# Patient Record
Sex: Female | Born: 1960 | Race: White | Hispanic: No | Marital: Married | Smoking: Never smoker
Health system: Southern US, Community
[De-identification: ages and names within clinical notes are randomized; demographics above are authoritative.]

## PROBLEM LIST (undated history)

## (undated) DIAGNOSIS — T7840XA Allergy, unspecified, initial encounter: Secondary | ICD-10-CM

## (undated) DIAGNOSIS — Z9013 Acquired absence of bilateral breasts and nipples: Secondary | ICD-10-CM

## (undated) DIAGNOSIS — Z5189 Encounter for other specified aftercare: Secondary | ICD-10-CM

## (undated) DIAGNOSIS — E079 Disorder of thyroid, unspecified: Secondary | ICD-10-CM

## (undated) DIAGNOSIS — C801 Malignant (primary) neoplasm, unspecified: Secondary | ICD-10-CM

## (undated) DIAGNOSIS — K279 Peptic ulcer, site unspecified, unspecified as acute or chronic, without hemorrhage or perforation: Secondary | ICD-10-CM

## (undated) DIAGNOSIS — F32A Depression, unspecified: Secondary | ICD-10-CM

## (undated) DIAGNOSIS — F329 Major depressive disorder, single episode, unspecified: Secondary | ICD-10-CM

## (undated) DIAGNOSIS — J309 Allergic rhinitis, unspecified: Secondary | ICD-10-CM

## (undated) HISTORY — DX: Acquired absence of bilateral breasts and nipples: Z90.13

## (undated) HISTORY — DX: Peptic ulcer, site unspecified, unspecified as acute or chronic, without hemorrhage or perforation: K27.9

## (undated) HISTORY — DX: Malignant (primary) neoplasm, unspecified: C80.1

## (undated) HISTORY — DX: Major depressive disorder, single episode, unspecified: F32.9

## (undated) HISTORY — PX: CHOLECYSTECTOMY: SHX55

## (undated) HISTORY — DX: Encounter for other specified aftercare: Z51.89

## (undated) HISTORY — PX: ESOPHAGEAL DILATION: SHX303

## (undated) HISTORY — PX: BREAST BIOPSY: SHX20

## (undated) HISTORY — DX: Allergy, unspecified, initial encounter: T78.40XA

## (undated) HISTORY — DX: Depression, unspecified: F32.A

## (undated) HISTORY — DX: Disorder of thyroid, unspecified: E07.9

## (undated) HISTORY — DX: Allergic rhinitis, unspecified: J30.9

## (undated) HISTORY — PX: ABDOMINAL HYSTERECTOMY: SHX81

---

## 2000-09-04 ENCOUNTER — Encounter: Admission: RE | Admit: 2000-09-04 | Discharge: 2000-09-04 | Payer: Self-pay | Admitting: Gastroenterology

## 2000-09-05 ENCOUNTER — Ambulatory Visit (HOSPITAL_COMMUNITY): Admission: RE | Admit: 2000-09-05 | Discharge: 2000-09-05 | Payer: Self-pay | Admitting: Gastroenterology

## 2003-04-25 ENCOUNTER — Emergency Department (HOSPITAL_COMMUNITY): Admission: EM | Admit: 2003-04-25 | Discharge: 2003-04-25 | Payer: Self-pay

## 2005-03-30 ENCOUNTER — Emergency Department (HOSPITAL_COMMUNITY): Admission: EM | Admit: 2005-03-30 | Discharge: 2005-03-30 | Payer: Self-pay | Admitting: Family Medicine

## 2007-03-26 ENCOUNTER — Other Ambulatory Visit: Admission: RE | Admit: 2007-03-26 | Discharge: 2007-03-26 | Payer: Self-pay | Admitting: Obstetrics and Gynecology

## 2007-11-09 ENCOUNTER — Ambulatory Visit (HOSPITAL_COMMUNITY): Admission: RE | Admit: 2007-11-09 | Discharge: 2007-11-09 | Payer: Self-pay | Admitting: Obstetrics and Gynecology

## 2008-07-18 ENCOUNTER — Emergency Department (HOSPITAL_COMMUNITY): Admission: EM | Admit: 2008-07-18 | Discharge: 2008-07-18 | Payer: Self-pay | Admitting: Emergency Medicine

## 2008-07-19 ENCOUNTER — Ambulatory Visit: Payer: Self-pay | Admitting: Cardiovascular Disease

## 2008-07-27 ENCOUNTER — Ambulatory Visit: Payer: Self-pay

## 2008-07-27 ENCOUNTER — Encounter: Payer: Self-pay | Admitting: Cardiovascular Disease

## 2009-07-26 ENCOUNTER — Encounter: Admission: RE | Admit: 2009-07-26 | Discharge: 2009-07-26 | Payer: Self-pay | Admitting: Internal Medicine

## 2009-08-20 ENCOUNTER — Emergency Department (HOSPITAL_COMMUNITY): Admission: EM | Admit: 2009-08-20 | Discharge: 2009-08-20 | Payer: Self-pay | Admitting: Family Medicine

## 2011-05-14 NOTE — H&P (Signed)
Kathleen Ballard, Kathleen Ballard              ACCOUNT NO.:  [id number]   MEDICAL RECORD NO.:  [id number]          PATIENT TYPE:  AMB   LOCATION:  [facility]                           FACILITY:  [facility]   PHYSICIAN:  James A. Ashley Royalty, M.D.DATE OF BIRTH:  Sep 13, 1961   DATE OF ADMISSION:  11/09/2007  DATE OF DISCHARGE:                              HISTORY & PHYSICAL   This is a 50 year old gravida 2, para 2, status post total vaginal  hysterectomy who presented to me March 2008, complaining of right lower  quadrant discomfort.  Subsequent ultrasound revealed surgical absence of  the uterus.  The adnexa were essentially unremarkable bilaterally.  Patient was followed expectantly since that time.  However, she states  in the last month or so, the pain has gotten worse and she now states it  is sufficiently debilitating to warrant surgical intervention.   MEDICATIONS:  Topamax, imipramine, Flexeril, Axert.   ALLERGIES:  PENICILLIN.   PAST MEDICAL HISTORY:  Migraine headaches.   PAST SURGICAL HISTORY:  1. Cholecystectomy.  2. Vaginal hysterectomy in 1990.   FAMILY HISTORY:  Positive for breast cancer.   SOCIAL HISTORY:  Patient denies the use of tobacco or significant  alcohol.   REVIEW OF SYSTEMS:  Noncontributory.   PHYSICAL EXAMINATION:  VITAL SIGNS:  Afebrile, vital signs stable.  GENERAL APPEARANCE:  A well-developed, well-nourished pleasant white  female in no acute distress.  CHEST:  Lungs are clear.  CARDIOVASCULAR:  Regular rate and rhythm.  ABDOMEN:  Soft and nontender.  MUSCULOSKELETAL:  No significant edema.  PELVIC:  Please see most recent office evaluation.  External genitalia  within normal limits.  Vagina is without gross lesions.  Vaginal cuff is  well-healed.  Bimanual examination revealed surgical absence of the  uterus.  I can appreciate no adnexal masses.   IMPRESSION:  1. Status post total vaginal hysterectomy.  2. Pelvic pain - etiology uncertain.  Differential includes  primary      gastrointestinal, endometriosis, adhesions.   PLAN:  Diagnostic/operative laparoscopy.  Risks, benefits, complications  and alternatives fully discussed with the patient.  Possibility of  unilateral or bilateral salpingo-oophorectomy discussed and accepted.  Possible need for exploratory laparotomy discussed and accepted.  Questions and invited and answered.      James A. Ashley Royalty, M.D.  Electronically Signed     JAM/MEDQ  D:  11/09/2007  T:  11/09/2007  Job:  [id number]

## 2011-05-14 NOTE — Assessment & Plan Note (Signed)
Kathleen Ballard                            CARDIOLOGY OFFICE NOTE   Kathleen Ballard                     MRN:          [id number]  DATE:07/19/2008                            DOB:          1961-03-24    Kathleen Ballard is a pleasant 50 year old patient referred by [facility] and [facility].   Specifically, Dr. Arville Care from Urgent Care arranged followup.  The patient  was in the emergency room on July 18, 2008, for palpitations.  Her  workup was benign.  Her lab work was okay.  There were no significant  arrhythmias on her monitor.   On talking to the patient, she has had occasional palpitations in the  past.  She has not had an episode in over a year.  She returned from a  movie on Sunday night and had palpitations.  They were not prolonged and  rapid.  She noted her heart skipping about every 8th beat.   There were no associated chest pain or shortness of breath.  No  diaphoresis.  They subsided at about 2 o'clock.  She noticed that if she  took slow deep breaths this helped, but nothing else seemed to help.  She has not been on medication for this.  She has had these palpitations  intermittently over the years, maybe 5 or 6 times.   The patient has not had a previous cardiac workup really approximately  20 years ago.  She had an episode of syncope and apparently had a Holter  monitor which was unrevealing.   She denies any recent URI.  She was not excessively tired or under  stress.  There were no extra stimulants on board, and she had not had  any food high in MSG or caffeine.   She has not had any problems since this past Sunday.   REVIEW OF SYSTEMS:  Otherwise remarkable for occasional headaches that  she has had since 2004.  They are not migrainous.  She does get  occasional wheezing and has an inhaler, but she did not have any Sunday  night.  There is a question of reflux and hiatal hernia.   Past medical  history is otherwise remarkable for previous gallbladder  surgery, hysterectomy, none laparoscopically, and several foot  surgeries.   The patient is Interior and spatial designer of [employer].  She has about 60 children  that she does.  She is a Publishing copy.  She is otherwise  fairly sedentary.  She is happily married.  She has 2 older girls, and  she enjoys reading.   She does not drink or smoke.   She has 1 or 2 caffeinated beverages a day.   ALLERGIES:  She is allergic to PENICILLIN and ERYTHROMYCIN.   She takes no medicines on a routine basis.  She does take gabapentin 800  mg at bedtime from time to time.   Her family history is noncontributory.   PHYSICAL EXAMINATION:  GENERAL:  Remarkable for healthy-appearing,  middle-aged white female in no distress.  VITAL SIGNS:  Weight is 218, blood pressure is 120/70, pulse  70 and  regular, respiratory rate 14, and afebrile.  HEENT:  Unremarkable.  NECK:  Carotids are normal without bruit.  No lymphadenopathy,  thyromegaly, or JVP elevation.  LUNGS:  Clear.  Good diaphragmatic motion.  No wheezing.  HEART:  S1 and S2.  Normal heart sounds.  PMI normal.  ABDOMEN:  Benign.  Bowel sounds positive.  No AAA.  No tenderness.  No  bruit.  No hepatosplenomegaly.  No hepatojugular reflux.  She is status  post gallbladder surgery and hysterectomy.  EXTREMITIES:  Distal pulses are intact.  No edema  NEURO:  Nonfocal.  SKIN:  Warm and dry.  MUSCULOSKELETAL:  No muscular weakness.   Electrocardiogram is reviewed and is normal.  There is no significant  PACs or PVCs.  QT interval is 366.   IMPRESSION:  1. Probable benign palpitations.  Check 2-D echocardiogram to rule out      structural heart disease and mitral valve prolapse.  So long as she      does not have recurrences, she will be seen p.r.n.  If she does      have recurrences, we will arrange an event monitor.  No indication      for beta-blocker therapy.  2. Postmenopausal.   Consider hormone replacement.  Followup [facility]      primary care.  3. Question previous neuropathy.  Continue gabapentin.  4. History of reflux.  Avoid spicy food and late-night meals.      Encourage weight loss.  Consider over-the-counter Prevacid.   So long as the patient's echo was normal, she will call us if she has  any recurrences so we can arrange an event monitor.     Kathleen Pick. Eden Emms, MD, [facility]  Electronically Signed    PCN/MedQ  DD: 07/19/2008  DT: 07/20/2008  Job #: [id number]

## 2011-05-14 NOTE — Op Note (Signed)
[place]              ACCOUNT NO.:  [id number]   MEDICAL RECORD NO.:  [id number]          PATIENT TYPE:  AMB   LOCATION:  [facility]C                           [facility]ACILITY:  [facility]   PHYSICIAN:  James A. Ashley Royalty, M.D.DATE OF BIRTH:  1961/08/21   DATE OF PROCEDURE:  11/09/2007  DATE OF DISCHARGE:  11/09/2007                               OPERATIVE REPORT   PREOPERATIVE DIAGNOSES:  1. Status post vaginal hysterectomy.  2. Pelvic pain, etiology uncertain.  Differential includes adhesions,      endometriosis.   POSTOPERATIVE DIAGNOSIS:  Pelvic adhesion from the colon to the visceral  peritoneum in the region of the bladder.   PROCEDURE:  1. Diagnostic/operative laparoscopy.  2. Lysis of adhesions.   SURGEON:  Rudy Jew. Ashley Royalty, M.D.   ANESTHESIA:  General.   ESTIMATED BLOOD LOSS:  Less than 25 mL.   COMPLICATIONS:  None.   PACKS AND DRAINS:  None.   PROCEDURE:  The patient was taken to the operating room and placed in  the dorsal supine position.  After satisfactory general anesthesia was  administered, she was placed in the low lithotomy position and prepped  and draped in the usual manner for abdominal and vaginal surgery.  Sponge stick was placed in the vagina.  The bladder was drained with an  in-and-out catheter.  A 1.2 cm infraumbilical incision was made.  Veress  needle was inserted into the abdominal cavity.  Its location was  verified by instillation of saline and hanging drop techniques.  An  attempt was made to create a pneumoperitoneum.  However, with 2 passes  with the Veress needle the patient's abdomen could not insufflate  properly.  The decision was made to proceed with direct trocar  insertion.  A size 10/11 disposable laparoscopic trocar was introduced  into the abdominal cavity.  Its location verified by placement of the  laparoscope.  There is no evidence of any trauma.  Pneumoperitoneum was  created and maintained throughout the case with CO2.   The  patient was placed in lithotomy position.  Two suprapubic 5 mm  trocars were placed in the left and right lower quadrants respectively.  Transillumination and direct visualization techniques were employed.  The pelvis was thoroughly surveyed.  Once again, there was no evidence  of any trauma.  The uterus was noted to be surgically absent.  With the  aid of the probe, the left adnexa was identified and noted to be free  and mobile.  The right adnexa was also identified and noted to be free  and mobile.  There were no cysts for any evidence of endometriosis  throughout the pelvis.  However, in the midline there was a dense  adhesion from the colon to the visceral peritoneum in the region of the  bladder.  After appropriate photographs were obtained, the Gyrus was  employed to safely coagulate the adhesion well away from the serosa of  the colon and well away from the bladder as well.  The adhesion was  successfully lysed and hemostasis noted.   The pelvis was once again surveyed and no other  anatomic abnormalities  noted.  Appropriate photographs were obtained.   At this point the patient was felt to have benefited maximally from the  surgical procedure.  The abdominal instruments were removed and  pneumoperitoneum evacuated.  Fascial defects were closed with 0 Vicryl  in a interrupted fashion.  The skin was closed with 3-0 Monocryl in a  subcuticular fashion.  The vaginal instrument was removed.   The patient was taken to the recovery room in excellent condition.      James A. Ashley Royalty, M.D.  Electronically Signed     JAM/MEDQ  D:  11/12/2007  T:  11/13/2007  Job:  [id number]

## 2011-05-17 NOTE — Procedures (Signed)
[place]  Patient:    Kathleen Ballard, Kathleen Ballard                     MRN: [id number] Proc. Date: 09/05/00 Adm. Date:  [id number] Attending:  Charna Elizabeth CC:         Meliton Rattan, M.D.   Procedure Report  DATE OF BIRTH:  10-01-1961  PROCEDURE:  Esophagogastroduodenoscopy.  ENDOSCOPIST:  Anselmo Rod, M.D.  INSTRUMENT USED:  Olympus video panendoscope.  INDICATIONS:  Dysphagia, question of odynophagia in a 50 year old white female, rule out esophagitis, esophageal stricture, etc.  PREPROCEDURE PREPARATION:  Informed consent was procured from the patient. The patient was fasted for eight hours prior to the procedure.  PREPROCEDURE PHYSICAL EXAMINATION:  VITAL SIGNS:  Stable.  NECK:  Supple.  CHEST:  Clear to auscultation.  HEART:  S1, S2 regular.  ABDOMEN:  Soft, with normal abdominal bowel sounds.  DESCRIPTION OF PROCEDURE:  The patient was placed in the left lateral decubitus position and sedated with 50 mg of Demerol and 5 mg of Versed intravenously.  Once the patient was adequately sedated, and maintained on low-flow oxygen, and continuous cardiac monitoring, the Olympus video panendoscope was advanced through the mouth piece, over the tongue, into the esophagus under direct vision.  The entire esophagus appeared normal, without evidence of ring, stricture, mass, lesion, esophagitis, or Barretts mucosa. There seemed to be good healthy peristalsis throughout the length of the esophagus.  The gastric mucosa also appeared healthy.  There was no evidence of a hiatal hernia on high retroflexion.  No erosions or ulcerations were seen in the stomach.  The duodenal bulb and small bowel distal to the bulb up to 70.0 cm appeared normal.  There was no outlet obstruction.  IMPRESSION:  Normal-appearing esophagus, normal esophagogastroduodenoscopy.  RECOMMENDATIONS: 1. The patient will be tried on Nulev 0.125 mg q.6-8h. p.r.n. spasm,  which may help her with her esophageal symptoms. 2. Esophageal manometry will be done if her symptoms persist. 3. Outpatient followup is advised in the next two weeks. DD:  09/05/00 TD:  09/06/00 Job: [id number]

## 2011-09-27 LAB — POCT I-STAT, CHEM 8
BUN: 13
Calcium, Ion: 1.13
Creatinine, Ser: 0.8
Glucose, Bld: 92
Sodium: 139
TCO2: 26

## 2011-10-08 LAB — ABO/RH: ABO/RH(D): B POS

## 2011-10-08 LAB — CBC
MCHC: 35.6
MCV: 85.2
RBC: 4.42
RDW: 13.6

## 2014-08-10 DIAGNOSIS — M25519 Pain in unspecified shoulder: Secondary | ICD-10-CM | POA: Insufficient documentation

## 2014-08-10 DIAGNOSIS — G8929 Other chronic pain: Secondary | ICD-10-CM | POA: Insufficient documentation

## 2014-11-03 ENCOUNTER — Telehealth: Payer: Self-pay | Admitting: Family

## 2014-11-03 ENCOUNTER — Ambulatory Visit (INDEPENDENT_AMBULATORY_CARE_PROVIDER_SITE_OTHER): Payer: BC Managed Care – PPO

## 2014-11-03 ENCOUNTER — Other Ambulatory Visit (INDEPENDENT_AMBULATORY_CARE_PROVIDER_SITE_OTHER): Payer: BC Managed Care – PPO

## 2014-11-03 ENCOUNTER — Ambulatory Visit (INDEPENDENT_AMBULATORY_CARE_PROVIDER_SITE_OTHER): Payer: BC Managed Care – PPO | Admitting: Family

## 2014-11-03 ENCOUNTER — Encounter: Payer: Self-pay | Admitting: Family

## 2014-11-03 VITALS — BP 140/94 | HR 113 | Temp 98.3°F | Resp 18 | Ht 70.0 in | Wt 230.0 lb

## 2014-11-03 DIAGNOSIS — Z23 Encounter for immunization: Secondary | ICD-10-CM | POA: Diagnosis not present

## 2014-11-03 DIAGNOSIS — Z0001 Encounter for general adult medical examination with abnormal findings: Secondary | ICD-10-CM | POA: Insufficient documentation

## 2014-11-03 DIAGNOSIS — Z0000 Encounter for general adult medical examination without abnormal findings: Secondary | ICD-10-CM | POA: Insufficient documentation

## 2014-11-03 LAB — CBC
HCT: 37.7 % (ref 36.0–46.0)
Hemoglobin: 12.8 g/dL (ref 12.0–15.0)
MCHC: 33.9 g/dL (ref 30.0–36.0)
MCV: 85.5 fl (ref 78.0–100.0)
PLATELETS: 170 10*3/uL (ref 150.0–400.0)
RBC: 4.41 Mil/uL (ref 3.87–5.11)
RDW: 13 % (ref 11.5–15.5)
WBC: 4.6 10*3/uL (ref 4.0–10.5)

## 2014-11-03 LAB — LIPID PANEL
CHOL/HDL RATIO: 3
CHOLESTEROL: 170 mg/dL (ref 0–200)
HDL: 55.2 mg/dL (ref >39.00)
LDL CALC: 94 mg/dL (ref 0–99)
NonHDL: 114.8
Triglycerides: 104 mg/dL (ref 0.0–149.0)
VLDL: 20.8 mg/dL (ref 0.0–40.0)

## 2014-11-03 LAB — BASIC METABOLIC PANEL
BUN: 21 mg/dL (ref 6–23)
CALCIUM: 8.8 mg/dL (ref 8.4–10.5)
CHLORIDE: 106 meq/L (ref 96–112)
CO2: 27 mEq/L (ref 19–32)
CREATININE: 0.9 mg/dL (ref 0.4–1.2)
GFR: 72.49 mL/min (ref >60.00)
Glucose, Bld: 106 mg/dL — ABNORMAL HIGH (ref 70–99)
POTASSIUM: 4.4 meq/L (ref 3.5–5.1)
SODIUM: 141 meq/L (ref 135–145)

## 2014-11-03 LAB — TSH: TSH: 0.58 u[IU]/mL (ref 0.35–4.50)

## 2014-11-03 LAB — HEMOGLOBIN A1C: Hgb A1c MFr Bld: 4.8 % (ref 4.6–6.5)

## 2014-11-03 NOTE — Patient Instructions (Signed)
Thank you for choosing [facility].  Summary/Instructions:   Please plan to follow up in about 6 months or sooner if needed  Continue current healthy habits and increase exercise as discussed.  Please stop by the lab prior to leaving for your blood work.

## 2014-11-03 NOTE — Progress Notes (Signed)
Pre visit review using our clinic review tool, if applicable. No additional management support is needed unless otherwise documented below in the visit note. 

## 2014-11-03 NOTE — Assessment & Plan Note (Signed)
1) Anticipatory Guidance: Discussed importance of wearing a seatbelt while driving and not texting while driving; changing batteries in smoke detector at least once annually; wearing suntan lotion when outside; eating a balanced and moderate diet; getting physical activity at least 30 minutes per day.  2) Immunizations / Screenings / Labs:  Flu shot was given today. Tetanus shot is up to date. / Due for dental and vision screens. May also be do for colonoscopy - discussed possibly using Cologuard. / Obtain CBC, A1c, Lipid profile and TSH.  Overall well exam. Goals are to lost about 5% of body weight and recheck blood pressure in about 6 months.

## 2014-11-03 NOTE — Progress Notes (Signed)
Subjective:    Patient ID: Kathleen Ballard, female    DOB: February 08, 1961, 53 y.o.   MRN: [id number]  Chief Complaint  Patient presents with  . Establish Care    concerned about BP and pulse   HPI:  Kathleen Ballard is a 53 y.o. female who presents today for an annual wellness visit.  Ativan, effexor and tamoxifen are managed by oncology.  1) Health Maintenance -   Diet - All over the place; plans to be eating as vegetarian during work hours; does not eat a lot of red meat; lactose intolerant   Exercise - Does not exist  2) Preventative Exams / Immunizations:  Dental -- Due for dental cleaning Vision -- Due eye exam Immunizations -- Flu shot done; Tetanus shot is up to date.  Colonoscopy -- Last time was in 2004; Bone Density -- Done in 2013  Review of Systems  Constitutional: Denies fever, chills, fatigue, or significant weight gain/loss. HENT: Head: Denies headache or neck pain Ears: Denies changes in hearing, ringing in ears, earache, drainage Nose: Denies discharge, stuffiness, itching, nosebleed, sinus pain Throat: Denies sore throat, hoarseness, dry mouth, sores, thrush Eyes: Denies loss/changes in vision, pain, redness, blurry/double vision, flashing lights Cardiovascular: Denies chest pain/discomfort, tightness, palpitations, shortness of breath with activity, difficulty lying down, swelling, sudden awakening with shortness of breath Respiratory: Denies shortness of breath, cough, sputum production, wheezing Gastrointestinal: Denies dysphasia, heartburn, change in appetite, nausea, change in bowel habits, rectal bleeding, constipation, diarrhea, yellow skin or eyes Genitourinary: Denies frequency, urgency, burning/pain, blood in urine, incontinence, change in urinary strength. Musculoskeletal: Denies muscle/joint pain, stiffness, back pain, redness or swelling of joints, trauma Skin: Denies rashes, lumps, itching, dryness, color changes, or hair/nail  changes Neurological: Denies fainting, seizures, weakness, numbness, tingling, tremor Dizziness occasional - related to cancer treatment Psychiatric - Denies nervousness, depression or memory loss Stress - maintained on Ativan and Effexor Endocrine: Denies heat or cold intolerance, sweating, frequent urination, excessive thirst, changes in appetite No control over temperature gauge of body Hematologic: Denies ease of  bleeding Bruises easily.    Objective:    BP 140/94 mmHg  Pulse 113  Temp(Src) 98.3 F (36.8 C) (Oral)  Resp 18  Ht 5\' 10"  (1.778 m)  Wt 230 lb (104.327 kg)  BMI 33.00 kg/m2  SpO2 96% Nursing note and vital signs reviewed.  Physical Exam  Constitutional: She is oriented to person, place, and time. She appears well-developed and well-nourished.  HENT:  Head: Normocephalic.  Right Ear: Hearing, tympanic membrane, external ear and ear canal normal.  Left Ear: Hearing, tympanic membrane, external ear and ear canal normal.  Nose: Nose normal.  Mouth/Throat: Uvula is midline, oropharynx is clear and moist and mucous membranes are normal.  Eyes: Conjunctivae and EOM are normal. Pupils are equal, round, and reactive to light.  Neck: Neck supple. No JVD present. No tracheal deviation present. No thyromegaly present.  Cardiovascular: Normal rate, regular rhythm, normal heart sounds and intact distal pulses.   Pulmonary/Chest: Effort normal and breath sounds normal.  Abdominal: Soft. Bowel sounds are normal. She exhibits no distension and no mass. There is no tenderness. There is no rebound and no guarding.  Musculoskeletal: Normal range of motion. She exhibits no edema or tenderness.  Lymphadenopathy:    She has no cervical adenopathy.  Neurological: She is alert and oriented to person, place, and time. She has normal reflexes. No cranial nerve deficit. She exhibits normal muscle tone. Coordination normal.  Skin: Skin  is warm and dry.  Psychiatric: She has a normal mood  and affect. Her behavior is normal. Judgment and thought content normal.       Assessment & Plan:

## 2014-11-03 NOTE — Telephone Encounter (Signed)
Please call the patient and inform her that her lab work has all come back within the normal limits. Please have her continue to work towards decreasing her weight by about 5% and increasing her fruit/vegetable and protein intake while reducing saturated fat.

## 2014-11-04 NOTE — Telephone Encounter (Signed)
Called pt to let her know results. No answer. Left message to call back

## 2014-11-04 NOTE — Telephone Encounter (Signed)
Let pt know her lab results were normal

## 2014-11-04 NOTE — Telephone Encounter (Signed)
Patient returned your call regarding her lab results. CB# [phone]

## 2015-10-11 ENCOUNTER — Encounter: Payer: Self-pay | Admitting: Podiatry

## 2015-10-11 ENCOUNTER — Ambulatory Visit (INDEPENDENT_AMBULATORY_CARE_PROVIDER_SITE_OTHER): Payer: BLUE CROSS/BLUE SHIELD

## 2015-10-11 ENCOUNTER — Ambulatory Visit (INDEPENDENT_AMBULATORY_CARE_PROVIDER_SITE_OTHER): Payer: BLUE CROSS/BLUE SHIELD | Admitting: Podiatry

## 2015-10-11 VITALS — BP 137/64 | HR 100 | Resp 12

## 2015-10-11 DIAGNOSIS — M722 Plantar fascial fibromatosis: Secondary | ICD-10-CM | POA: Diagnosis not present

## 2015-10-11 DIAGNOSIS — R52 Pain, unspecified: Secondary | ICD-10-CM | POA: Diagnosis not present

## 2015-10-11 DIAGNOSIS — M216X2 Other acquired deformities of left foot: Secondary | ICD-10-CM | POA: Diagnosis not present

## 2015-10-11 NOTE — Patient Instructions (Signed)
Today your examination demonstrated plantar fasciitis of the right foot the medial band Wear that were plantar fasciitis strap on the right foot The left foot has a relatively flex second metatarsal causing excessive pressure in the second MPJ, ball area I attached a metatarsal felt pad in your shoes to see if this reduces some of the pressure in the area Your x-ray demonstrated some arthritic change in the first great toe joint and a residual bunion deformity in the right     Bent - Knee Calf Stretch  1) Stand an arm's length away from a wall. Place the palms of your hands on the wall. Step forward about 12 inches with the opposite foot.  2) Keeping toes pointed forward and both heels on the floor, bend both knees and lean forward. Hold this position for 60 seconds. Don't arch your back and don't hunch your shoulders.  3) Repeat this twice.  DO THIS STRETCHING TECHNIQUE 3 TIMES A DAY.   Stretching Exercises before Standing  Pull your toes up toward your nose and hold for 1 minute before standing.  A towel can assist with this exercise if you put the towel under the ball of your foot. This exercise reduces the intense   pain associated when changing from a seated to a standing position. This stretch can usually be the most beneficial if done before getting out of bed in the mornings.

## 2015-10-11 NOTE — Progress Notes (Signed)
   Subjective:    Patient ID: Kathleen Ballard, female    DOB: Nov 17, 1961, 54 y.o.   MRN: [id number]  HPI    This patient presents today concerned with 2 problems. She is complaining of approximately 2 year history of right foot pain along the medial plantar aspect of the right foot activated with standing walking relieved with rest. The discomfort is gradually worsened over time. Patient has tried multiple shoes with some occasional temporary relief of symptoms. She is also complaining of pain on the plantar aspect of the second MPJ area of the left foot painful on weightbearing and relieved with rest with gradual increasing pain over time. Also, patient has tried shoe change without relief of symptoms. Patient relates history of foot surgery including bunionectomies approximately 50 years ago as well as possible neuroma like surgery on the left foot. He said the bunion surgery resolved the pain in the bunion joints bilaterally, however, she has noticed the right foot has a residual bunion deformity. She also describes attempting to wear shoe insert some 1520 years ago which did not relieve any of these symptoms. 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  Review of Systems     Objective:   Physical Exam  Pleasant orientated 3 Patient is approximated 5 foot 10 and approximately 240 pounds  Vascular: No peripheral edema noted bilaterally DP and PT pulses 2/4 bilaterally Capillary reflex immediate bilaterally  Neurological: Sensation to 10 g monofilament wire intact 5/5 bilaterally Vibratory sensation intact bilaterally Ankle reflex equal and reactive bilaterally  Dermatological: Well-healed surgical scars dorsal medial right first MPJ Well-healed surgical scars dorsal medial left first MPJ and dorsal second and third MPJ area Plantar keratoses subsecond MPJ with overlying prominent metatarsal head  Musculoskeletal: 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 This area duplicates patient's discomfort in the right foot. Palpable tenderness plantar second MPJ left with overlying prominent second metatarsal head   X-ray examination weightbearing left foot dated 10/11/2015  Intact bony structure without fracture and/or dislocation Surgical resection medial condyle head of first metatarsal Decrease joint space first metatarsophalangeal joint Resection head a proximal phalanx fifth toe Hammertoe second Well-organized inferior calcaneal spur  Radiographic impression: No acute bony abnormality noted left foot  X-ray examination weight bearing right foot dated 10/11/2015  Intact bony structure without fracture and/or dislocation Posterior and inferior calcaneal spurs Surgical resection medial condyle head of first metatarsal HAV deformity Radiographic impression: No acute bony abnormality noted in the right foot       Assessment & Plan:   Assessment: Plantar fasciitis right 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 For the right foot I dispensed a fasciitis strap for patient aware and prescribed home stretching exercise. Patient has a night splint from previous fasciitis episode and she will also wear this splint on the right foot I attached a felt metatarsal pad to patient's insole to see if further offloading would provide patient relief and gave her additional pads. Patient will follow these instructions and return if the symptoms are not improving gradually over time. If patient returns additional treatment could consider an additional custom foot orthotic as well as possible low-energy shock wave  Reappoint at patient's request

## 2015-10-23 DIAGNOSIS — C50819 Malignant neoplasm of overlapping sites of unspecified female breast: Secondary | ICD-10-CM | POA: Insufficient documentation

## 2015-11-25 ENCOUNTER — Ambulatory Visit (INDEPENDENT_AMBULATORY_CARE_PROVIDER_SITE_OTHER): Payer: BLUE CROSS/BLUE SHIELD | Admitting: Physician Assistant

## 2015-11-25 VITALS — BP 138/86 | HR 137 | Temp 98.3°F | Resp 16 | Ht 71.0 in | Wt 250.2 lb

## 2015-11-25 DIAGNOSIS — R059 Cough, unspecified: Secondary | ICD-10-CM

## 2015-11-25 DIAGNOSIS — R05 Cough: Secondary | ICD-10-CM | POA: Diagnosis not present

## 2015-11-25 DIAGNOSIS — J029 Acute pharyngitis, unspecified: Secondary | ICD-10-CM

## 2015-11-25 MED ORDER — BENZONATATE 100 MG PO CAPS: 100.0000 mg | ORAL_CAPSULE | Freq: Three times a day (TID) | ORAL | Status: DC | PRN

## 2015-11-25 MED ORDER — IPRATROPIUM BROMIDE 0.03 % NA SOLN: 2.0000 | Freq: Two times a day (BID) | NASAL | Status: DC

## 2015-11-25 NOTE — Patient Instructions (Addendum)
Use acetaminophen (not ibuprofen or Aleve) for headache and throat pain. Use OTC Mucinex Maximum Strength to thin the mucous. Get plenty of rest and drink at least 64 ounces of water daily. Restart the omeprazole (Prilosec) 20 mg one or two each day.

## 2015-11-25 NOTE — Progress Notes (Signed)
Subjective:   Patient ID: Kathleen Ballard, female     DOB: December 20, 1961, 54 y.o.    MRN: [id number]  PCP: Mauricio Po, FNP  Chief Complaint  Patient presents with  . Sore Throat    today morning  . Cough    HPI  Presents for evaluation of cough and sore throat.  Awoke on Thursday 11/23/2015 (Thanksgiving) "feeling pretty nasty." Not uncommon for her when she's tired. Every time she ate she experienced abdominal pain, and didn't vomit, but felt like when she's had ulcers in the past. "I don't throw up." Previously took omeprazole 40 mg daily, but hasn't in some time. Notes occasional water brash.  This morning she awoke with sore throat on the RIGHT and cough. Cough is non-productive.  No diarrhea. Nasal congestion on the RIGHT. RIGHT sided headache. No fever or chills.  Prior to Admission medications   Medication Sig Start Date End Date Taking? Authorizing Provider  anastrozole (ARIMIDEX) 1 MG tablet Take 1 mg by mouth daily.   Yes Historical Provider, MD  venlafaxine XR (EFFEXOR-XR) 150 MG 24 hr capsule Take 150 mg by mouth 2 (two) times daily. 09/17/15  Yes Historical Provider, MD  LORazepam (ATIVAN) 1 MG tablet Take 1 mg by mouth as needed for anxiety.    Historical Provider, MD     Allergies  Allergen Reactions  . Erythromycin Nausea Only    Makes her have a sick feeling.  Marland Kitchen Penicillins     Was told to never take during allergy testing     Patient Active Problem List   Diagnosis Date Noted  . Overlapping malignant neoplasm of female breast ([place]) 10/23/2015  . Pain in shoulder 08/10/2014     Family History  Problem Relation Age of Onset  . Heart disease Mother   . Heart disease Father   . Heart disease Maternal Grandmother   . Cancer Maternal Grandmother     Breast cancer  . COPD Maternal Grandfather      Social History   Social History  . Marital Status: Married    Spouse Name: [name]  . Number of Children: 2  . Years of Education: 18    Occupational History  . preschool Mudlogger    Social History Main Topics  . Smoking status: Never Smoker   . Smokeless tobacco: Never Used  . Alcohol Use: No  . Drug Use: No  . Sexual Activity: Not on file   Other Topics Concern  . Not on file   Social History Narrative   Born and raised in [place], [place]. Currently resides in a private residence with her husband [name]) and daughter. Other daughter lives independently in [place], [place].    Pets - 4 dogs and 1 cat;    Fun: Watch movies and cross-stitch.    Denies religious beliefs that would effect healthcare.         Review of Systems As above.      Objective:  Physical Exam  Constitutional: She is oriented to person, place, and time. Vital signs are normal. She appears well-developed and well-nourished. She is active and cooperative. No distress.  BP 138/86 mmHg  Pulse 137  Temp(Src) 98.3 F (36.8 C) (Oral)  Resp 16  Ht '5\' 11"'$  (1.803 m)  Wt 250 lb 3.2 oz (113.49 kg)  BMI 34.91 kg/m2  SpO2 98%  HENT:  Head: Normocephalic and atraumatic.  Right Ear: Hearing, tympanic membrane, external ear and ear canal normal.  Left Ear: Hearing, tympanic membrane,  external ear and ear canal normal.  Nose: Mucosal edema and rhinorrhea present.  Mouth/Throat: Oropharynx is clear and moist and mucous membranes are normal. No oral lesions. Normal dentition. No oropharyngeal exudate.  Eyes: Conjunctivae, EOM and lids are normal. Pupils are equal, round, and reactive to light. No scleral icterus.  Neck: Normal range of motion, full passive range of motion without pain and phonation normal. Neck supple. No thyromegaly present.  Cardiovascular: Normal rate, regular rhythm and normal heart sounds.   Pulses:      Radial pulses are 2+ on the right side, and 2+ on the left side.  Regular rate on exam.  Pulmonary/Chest: Effort normal and breath sounds normal.  Lymphadenopathy:       Head (right side): No tonsillar, no preauricular, no  posterior auricular and no occipital adenopathy present.       Head (left side): No tonsillar, no preauricular, no posterior auricular and no occipital adenopathy present.    She has no cervical adenopathy.       Right: No supraclavicular adenopathy present.       Left: No supraclavicular adenopathy present.  Neurological: She is alert and oriented to person, place, and time. No sensory deficit.  Skin: Skin is warm, dry and intact. No rash noted. No cyanosis or erythema. Nails show no clubbing.  Psychiatric: She has a normal mood and affect. Her speech is normal and behavior is normal.             Assessment & Plan:  1. Cough 2. Sore throat Likely viral illness causing congestion and PND on the right, causing sore throat and cough. Supportive care/symptomatic treatment. Possibly exacerbated by GERD. Resume OTC omeprazole. Anticipatory guidance. Follow-up PRN. - benzonatate (TESSALON) 100 MG capsule; Take 1-2 capsules (100-200 mg total) by mouth 3 (three) times daily as needed for cough.  Dispense: 40 capsule; Refill: 0 - ipratropium (ATROVENT) 0.03 % nasal spray; Place 2 sprays into both nostrils 2 (two) times daily.  Dispense: 30 mL; Refill: 0    Fara Chute, PA-C Physician Assistant-Certified [facility]

## 2016-02-15 ENCOUNTER — Ambulatory Visit (INDEPENDENT_AMBULATORY_CARE_PROVIDER_SITE_OTHER): Payer: BLUE CROSS/BLUE SHIELD | Admitting: Physician Assistant

## 2016-02-15 VITALS — BP 141/89 | HR 101 | Temp 100.7°F | Resp 20 | Ht 70.47 in | Wt 256.4 lb

## 2016-02-15 DIAGNOSIS — R509 Fever, unspecified: Secondary | ICD-10-CM

## 2016-02-15 DIAGNOSIS — R51 Headache: Secondary | ICD-10-CM

## 2016-02-15 DIAGNOSIS — R05 Cough: Secondary | ICD-10-CM | POA: Diagnosis not present

## 2016-02-15 DIAGNOSIS — J111 Influenza due to unidentified influenza virus with other respiratory manifestations: Secondary | ICD-10-CM | POA: Diagnosis not present

## 2016-02-15 DIAGNOSIS — R69 Illness, unspecified: Principal | ICD-10-CM

## 2016-02-15 DIAGNOSIS — R519 Headache, unspecified: Secondary | ICD-10-CM

## 2016-02-15 LAB — POCT INFLUENZA A/B
INFLUENZA A, POC: POSITIVE — AB
INFLUENZA B, POC: NEGATIVE

## 2016-02-15 MED ORDER — KETOROLAC TROMETHAMINE 60 MG/2ML IM SOLN
60.0000 mg | Freq: Once | INTRAMUSCULAR | Status: AC
  Administered 2016-02-15: 60 mg via INTRAMUSCULAR

## 2016-02-15 MED ORDER — OSELTAMIVIR PHOSPHATE 75 MG PO CAPS: 75.0000 mg | ORAL_CAPSULE | Freq: Two times a day (BID) | ORAL | Status: DC

## 2016-02-15 NOTE — Progress Notes (Signed)
   02/15/2016 7:50 PM   DOB: 10-Jul-1961 / MRN: [id number]  SUBJECTIVE:  Kathleen Ballard is a 55 y.o. female presenting for HA, fever, cough, and back myalgia that all started last night. She has not had the seasonal flu shot.    She is allergic to erythromycin and penicillins.   She  has a past medical history of H/O bilateral mastectomy; Blood transfusion without reported diagnosis; Allergy; Cancer ([place]); Depression; Thyroid disease; and PUD (peptic ulcer disease).    She  reports that she has never smoked. She has never used smokeless tobacco. She reports that she does not drink alcohol or use illicit drugs. She  has no sexual activity history on file. The patient  has past surgical history that includes Cholecystectomy; Breast biopsy; Abdominal hysterectomy; and Esophageal dilation.  Her family history includes COPD in her maternal grandfather; Cancer in her maternal grandmother; Heart disease in her father, maternal grandmother, and mother.  ROS  Per HPI  Problem list and medications reviewed and updated by myself where necessary, and exist elsewhere in the encounter.   OBJECTIVE:  BP 141/89 mmHg  Pulse 101  Temp(Src) 100.7 F (38.2 C) (Oral)  Resp 20  Ht 5' 10.47" (1.79 m)  Wt 256 lb 6.4 oz (116.302 kg)  BMI 36.30 kg/m2  SpO2 97%  Physical Exam  Constitutional: She is oriented to person, place, and time. She appears well-nourished. She appears ill. No distress.  HENT:  Right Ear: Hearing and tympanic membrane normal.  Left Ear: Hearing and tympanic membrane normal.  Nose: Nose normal.  Mouth/Throat: Uvula is midline, oropharynx is clear and moist and mucous membranes are normal.  Eyes: EOM are normal. Pupils are equal, round, and reactive to light.  Cardiovascular: Normal rate and regular rhythm.   Pulmonary/Chest: Effort normal.  Abdominal: She exhibits no distension.  Neurological: She is alert and oriented to person, place, and time. No cranial nerve deficit. Gait  normal.  Skin: Skin is dry. She is not diaphoretic.  Psychiatric: She has a normal mood and affect.  Vitals reviewed.   Results for orders placed or performed in visit on 02/15/16 (from the past 72 hour(s))  POCT Influenza A/B     Status: Abnormal   Collection Time: 02/15/16  7:43 PM  Result Value Ref Range   Influenza A, POC Positive (A) Negative   Influenza B, POC Negative Negative    No results found.  ASSESSMENT AND PLAN  Kathleen Ballard was seen today for influenza.  Diagnoses and all orders for this visit:  Influenza-like illness -     POCT Influenza A/B -     oseltamivir (TAMIFLU) 75 MG capsule; Take 1 capsule (75 mg total) by mouth 2 (two) times daily.  Acute nonintractable headache, unspecified headache type -     ketorolac (TORADOL) injection 60 mg; Inject 2 mLs (60 mg total) into the muscle once.    The patient was advised to call or return to clinic if she does not see an improvement in symptoms or to seek the care of the closest emergency department if she worsens with the above plan.   Kathleen Ballard, MHS, PA-C [facility] 02/15/2016 7:50 PM

## 2016-02-21 ENCOUNTER — Other Ambulatory Visit: Payer: Self-pay | Admitting: Physician Assistant

## 2016-02-21 ENCOUNTER — Telehealth: Payer: Self-pay

## 2016-02-21 MED ORDER — DOXYCYCLINE HYCLATE 100 MG PO CAPS: 100.0000 mg | ORAL_CAPSULE | Freq: Two times a day (BID) | ORAL | Status: DC

## 2016-02-21 NOTE — Telephone Encounter (Signed)
PATIENT STATES SHE SAW MICHAEL CLARK ON Thursday AND SHE WAS DIAGNOSED WITH THE FLU. SHE HAS TAKEN ALL 5 DAYS OF THE TAMIFLU AND SHE IS STILL RUNNING A FEVER DAY AND NIGHT. SHE ALSO IS STILL VERY ACHY AND STILL HAS THE COUGH. SHE WOULD LIKE TO KNOW WHAT SHE SHOULD DO NEXT? BEST PHONE [phone] (CELL)  PHARMACY CHOICE IS [facility] ON [address].  [place]

## 2016-02-21 NOTE — Telephone Encounter (Signed)
Left message on voicemail of patient after receiving an after hours call. Advised patient she needed a return visit if she is short of breath that is new or worse.

## 2016-02-21 NOTE — Telephone Encounter (Signed)
Patient is still running a fever of 100.7 coughing a lot and short of breath after 5 days on tamiflu.  She is also having body aches as well.  I advised her that the flu can take 7 to 10 days to run its course.  Please advise on what you would like for her to do.  Thank you

## 2016-02-22 ENCOUNTER — Ambulatory Visit (INDEPENDENT_AMBULATORY_CARE_PROVIDER_SITE_OTHER): Payer: BLUE CROSS/BLUE SHIELD

## 2016-02-22 ENCOUNTER — Ambulatory Visit (INDEPENDENT_AMBULATORY_CARE_PROVIDER_SITE_OTHER): Payer: BLUE CROSS/BLUE SHIELD | Admitting: Family Medicine

## 2016-02-22 VITALS — BP 136/80 | HR 124 | Temp 98.4°F | Resp 16 | Ht 70.0 in | Wt 247.0 lb

## 2016-02-22 DIAGNOSIS — J9801 Acute bronchospasm: Secondary | ICD-10-CM

## 2016-02-22 DIAGNOSIS — R000 Tachycardia, unspecified: Secondary | ICD-10-CM

## 2016-02-22 DIAGNOSIS — R059 Cough, unspecified: Secondary | ICD-10-CM

## 2016-02-22 DIAGNOSIS — R05 Cough: Secondary | ICD-10-CM | POA: Diagnosis not present

## 2016-02-22 DIAGNOSIS — J101 Influenza due to other identified influenza virus with other respiratory manifestations: Secondary | ICD-10-CM | POA: Diagnosis not present

## 2016-02-22 DIAGNOSIS — Z131 Encounter for screening for diabetes mellitus: Secondary | ICD-10-CM | POA: Diagnosis not present

## 2016-02-22 DIAGNOSIS — E0500 Thyrotoxicosis with diffuse goiter without thyrotoxic crisis or storm: Secondary | ICD-10-CM

## 2016-02-22 LAB — CBC WITH DIFFERENTIAL/PLATELET
BASOS ABS: 0.1 10*3/uL (ref 0.0–0.1)
Basophils Relative: 1 % (ref 0–1)
Eosinophils Absolute: 0.2 10*3/uL (ref 0.0–0.7)
Eosinophils Relative: 3 % (ref 0–5)
HEMATOCRIT: 39.4 % (ref 36.0–46.0)
HEMOGLOBIN: 13.1 g/dL (ref 12.0–15.0)
LYMPHS PCT: 22 % (ref 12–46)
Lymphs Abs: 1.4 10*3/uL (ref 0.7–4.0)
MCH: 26.8 pg (ref 26.0–34.0)
MCHC: 33.2 g/dL (ref 30.0–36.0)
MCV: 80.7 fL (ref 78.0–100.0)
MPV: 10.5 fL (ref 8.6–12.4)
Monocytes Absolute: 0.6 10*3/uL (ref 0.1–1.0)
Monocytes Relative: 10 % (ref 3–12)
NEUTROS ABS: 4.1 10*3/uL (ref 1.7–7.7)
NEUTROS PCT: 64 % (ref 43–77)
Platelets: 381 10*3/uL (ref 150–400)
RBC: 4.88 MIL/uL (ref 3.87–5.11)
RDW: 14.2 % (ref 11.5–15.5)
WBC: 6.4 10*3/uL (ref 4.0–10.5)

## 2016-02-22 LAB — TSH: TSH: 0.28 mIU/L — ABNORMAL LOW

## 2016-02-22 LAB — T4, FREE: Free T4: 1.5 ng/dL (ref 0.8–1.8)

## 2016-02-22 LAB — GLUCOSE, POCT (MANUAL RESULT ENTRY): POC Glucose: 132 mg/dl — AB (ref 70–99)

## 2016-02-22 MED ORDER — BENZONATATE 100 MG PO CAPS: 100.0000 mg | ORAL_CAPSULE | Freq: Three times a day (TID) | ORAL | Status: DC | PRN

## 2016-02-22 MED ORDER — ALBUTEROL SULFATE (2.5 MG/3ML) 0.083% IN NEBU
2.5000 mg | INHALATION_SOLUTION | Freq: Once | RESPIRATORY_TRACT | Status: AC
  Administered 2016-02-22: 2.5 mg via RESPIRATORY_TRACT

## 2016-02-22 MED ORDER — PREDNISONE 20 MG PO TABS: ORAL_TABLET | ORAL | Status: DC

## 2016-02-22 MED ORDER — MUCINEX DM 30-600 MG PO TB12: 1.0000 | ORAL_TABLET | Freq: Two times a day (BID) | ORAL | Status: DC

## 2016-02-22 MED ORDER — ALBUTEROL SULFATE HFA 108 (90 BASE) MCG/ACT IN AERS: 2.0000 | INHALATION_SPRAY | Freq: Four times a day (QID) | RESPIRATORY_TRACT | Status: DC | PRN

## 2016-02-22 NOTE — Patient Instructions (Signed)
NOTE:  Because you received an x-ray today, you will receive an invoice from [facility]. Please contact [facility] at [phone] with questions or concerns regarding your invoice. Our billing staff will not be able to assist you with those questions.

## 2016-02-22 NOTE — Telephone Encounter (Signed)
I spoke with this patient on the phone.  She has some concerning symptoms of SOB, cough and fever, and felt that she was getting better on Sunday but then worsened.  I am concerned for a bacterial superinfection, particularly staph pneumonia.  Will start doxy and advised that she return to clinic ASAP or go to the ED if getting worse.  Philis Fendt, MS, PA-C 11:29 AM, 02/22/2016

## 2016-02-22 NOTE — Patient Instructions (Signed)

## 2016-02-22 NOTE — Progress Notes (Signed)
Subjective:    Patient ID: Kathleen Ballard, female    DOB: [birth year], 55 y.o.   MRN: [id number]  02/22/2016  Follow-up   HPI This 55 y.o. female presents for evaluation of persistent cough and inability to take deep breath.  Evaluated one week ago; tested + for influenza A; completed all of Tamiflu.  Now having different symptoms.  Started worsening four days into illness and has progressively worsened; had started feeling better for 2 days and then declined.  +fever broke last night; Tmax 1006.  +chills/sweats had resolved and then recurred; none today thus far.  +HA chronic without worsening.  No ear pain; +ST with coughing.  No rhinorrhea; mild nasal congestion.  No PND now.  +coughing a lot; +SOB with ambulation.  Coughing makes SOB worse.  No wheezing.  No v/d.  No tobacco; childhood asthma.  Preschool Director.    Chronic tachycardia since college.  Diagnosed with Graves disease years ago; no previous treatment; no longer followed by endocrinology.   Review of Systems  Constitutional: Positive for fever, chills, diaphoresis and fatigue.  HENT: Positive for congestion, sore throat and voice change. Negative for ear pain, rhinorrhea, sinus pressure and trouble swallowing.   Respiratory: Positive for cough and shortness of breath. Negative for wheezing.   Gastrointestinal: Negative for nausea, vomiting and diarrhea.  Neurological: Positive for headaches. Negative for dizziness.    Past Medical History  Diagnosis Date  . H/O bilateral mastectomy   . Blood transfusion without reported diagnosis   . Allergy     Seasonal  . Cancer ([place])     Stage III breast cancer - in remission  . Depression   . Thyroid disease     Graves' Disease  . PUD (peptic ulcer disease)    Past Surgical History  Procedure Laterality Date  . Cholecystectomy    . Breast biopsy    . Abdominal hysterectomy      Partial - both ovaries remain.  . Esophageal dilation     Allergies  Allergen Reactions    . Erythromycin Nausea Only    Makes her have a sick feeling.  Marland Kitchen Penicillins     Was told to never take during allergy testing   Current Outpatient Prescriptions  Medication Sig Dispense Refill  . doxycycline (VIBRAMYCIN) 100 MG capsule Take 100 mg by mouth 2 (two) times daily.    Marland Kitchen venlafaxine XR (EFFEXOR-XR) 150 MG 24 hr capsule Take 150 mg by mouth 2 (two) times daily.  6   Current Facility-Administered Medications  Medication Dose Route Frequency Provider Last Rate Last Dose  . albuterol (PROVENTIL) (2.5 MG/3ML) 0.083% nebulizer solution 2.5 mg  2.5 mg Nebulization Once Wardell Honour, MD       Social History   Social History  . Marital Status: Married    Spouse Name: [name]  . Number of Children: 2  . Years of Education: 18   Occupational History  . preschool Mudlogger    Social History Main Topics  . Smoking status: Never Smoker   . Smokeless tobacco: Never Used  . Alcohol Use: No  . Drug Use: No  . Sexual Activity: Not on file   Other Topics Concern  . Not on file   Social History Narrative   Born and raised in [place], [place]. Currently resides in a private residence with her husband [name]) and daughter. Other daughter lives independently in [place], [place].    Pets - 4 dogs and 1 cat;  Fun: Transport planner.    Denies religious beliefs that would effect healthcare.    Family History  Problem Relation Age of Onset  . Heart disease Mother   . Heart disease Father   . Heart disease Maternal Grandmother   . Cancer Maternal Grandmother     Breast cancer  . COPD Maternal Grandfather        Objective:    BP 136/80 mmHg  Pulse 124  Temp(Src) 98.4 F (36.9 C) (Oral)  Resp 16  Ht '5\' 10"'$  (1.778 m)  Wt 247 lb (112.038 kg)  BMI 35.44 kg/m2  SpO2 96% Physical Exam  Constitutional: She is oriented to person, place, and time. She appears well-developed and well-nourished. No distress.  HENT:  Head: Normocephalic and atraumatic.  Right Ear:  Tympanic membrane, external ear and ear canal normal.  Left Ear: Tympanic membrane, external ear and ear canal normal.  Nose: Nose normal. No mucosal edema or rhinorrhea. Right sinus exhibits no maxillary sinus tenderness and no frontal sinus tenderness. Left sinus exhibits no maxillary sinus tenderness and no frontal sinus tenderness.  Mouth/Throat: Oropharynx is clear and moist.  Eyes: Conjunctivae are normal. Pupils are equal, round, and reactive to light.  Neck: Normal range of motion. Neck supple.  Cardiovascular: Regular rhythm and normal heart sounds.  Tachycardia present.  Exam reveals no gallop and no friction rub.   No murmur heard. Pulmonary/Chest: Effort normal. No respiratory distress. She has wheezes in the right upper field, the right middle field, the right lower field, the left upper field, the left middle field and the left lower field. She has no rhonchi. She has no rales.  Musculoskeletal: She exhibits no edema.  Neurological: She is alert and oriented to person, place, and time.  Skin: She is not diaphoretic.  Psychiatric: She has a normal mood and affect. Her behavior is normal.  Nursing note and vitals reviewed.  Results for orders placed or performed in visit on 02/15/16  POCT Influenza A/B  Result Value Ref Range   Influenza A, POC Positive (A) Negative   Influenza B, POC Negative Negative       Assessment & Plan:   1. Bronchospasm   2. Influenza due to influenza A virus   3. Graves disease   4. Tachycardia   5. Screening for diabetes mellitus     Orders Placed This Encounter  Procedures  . DG Chest 2 View    Standing Status: Future     Number of Occurrences:      Standing Expiration Date: 02/21/2017    Order Specific Question:  Reason for Exam (SYMPTOM  OR DIAGNOSIS REQUIRED)    Answer:  influenza A, wheezing, SOB    Order Specific Question:  Is the patient pregnant?    Answer:  No    Order Specific Question:  Preferred imaging location?    Answer:   External  . CBC with Differential/Platelet  . TSH  . T4, free  . POCT glucose (manual entry)   Meds ordered this encounter  Medications  . doxycycline (VIBRAMYCIN) 100 MG capsule    Sig: Take 100 mg by mouth 2 (two) times daily.  Marland Kitchen albuterol (PROVENTIL) (2.5 MG/3ML) 0.083% nebulizer solution 2.5 mg    Sig:     No Follow-up on file.    [place] Marney Elayne Guerin, M.D. [facility] [address] [place], [place]  [address] [phone] phone [phone] fax

## 2016-08-02 ENCOUNTER — Ambulatory Visit (INDEPENDENT_AMBULATORY_CARE_PROVIDER_SITE_OTHER): Payer: BLUE CROSS/BLUE SHIELD | Admitting: Internal Medicine

## 2016-08-02 ENCOUNTER — Other Ambulatory Visit (INDEPENDENT_AMBULATORY_CARE_PROVIDER_SITE_OTHER): Payer: BLUE CROSS/BLUE SHIELD

## 2016-08-02 ENCOUNTER — Encounter: Payer: Self-pay | Admitting: Internal Medicine

## 2016-08-02 VITALS — BP 118/70 | HR 107 | Temp 98.3°F | Ht 66.0 in | Wt 247.0 lb

## 2016-08-02 DIAGNOSIS — R609 Edema, unspecified: Secondary | ICD-10-CM

## 2016-08-02 DIAGNOSIS — J45901 Unspecified asthma with (acute) exacerbation: Secondary | ICD-10-CM | POA: Insufficient documentation

## 2016-08-02 DIAGNOSIS — J4531 Mild persistent asthma with (acute) exacerbation: Secondary | ICD-10-CM

## 2016-08-02 DIAGNOSIS — J309 Allergic rhinitis, unspecified: Secondary | ICD-10-CM

## 2016-08-02 HISTORY — DX: Unspecified asthma with (acute) exacerbation: J45.901

## 2016-08-02 LAB — CBC WITH DIFFERENTIAL/PLATELET
BASOS ABS: 0 10*3/uL (ref 0.0–0.1)
BASOS PCT: 0.6 % (ref 0.0–3.0)
EOS ABS: 0.3 10*3/uL (ref 0.0–0.7)
Eosinophils Relative: 4.1 % (ref 0.0–5.0)
HEMATOCRIT: 38.3 % (ref 36.0–46.0)
HEMOGLOBIN: 12.9 g/dL (ref 12.0–15.0)
LYMPHS PCT: 23.1 % (ref 12.0–46.0)
Lymphs Abs: 1.5 10*3/uL (ref 0.7–4.0)
MCHC: 33.8 g/dL (ref 30.0–36.0)
MCV: 79.1 fl (ref 78.0–100.0)
MONO ABS: 0.6 10*3/uL (ref 0.1–1.0)
Monocytes Relative: 9.1 % (ref 3.0–12.0)
Neutro Abs: 4.1 10*3/uL (ref 1.4–7.7)
Neutrophils Relative %: 63.1 % (ref 43.0–77.0)
Platelets: 210 10*3/uL (ref 150.0–400.0)
RBC: 4.85 Mil/uL (ref 3.87–5.11)
RDW: 14.5 % (ref 11.5–15.5)
WBC: 6.4 10*3/uL (ref 4.0–10.5)

## 2016-08-02 LAB — BASIC METABOLIC PANEL
BUN: 16 mg/dL (ref 6–23)
CHLORIDE: 104 meq/L (ref 96–112)
CO2: 31 meq/L (ref 19–32)
CREATININE: 0.85 mg/dL (ref 0.40–1.20)
Calcium: 9.6 mg/dL (ref 8.4–10.5)
GFR: 73.97 mL/min (ref >60.00)
GLUCOSE: 101 mg/dL — AB (ref 70–99)
POTASSIUM: 4.5 meq/L (ref 3.5–5.1)
Sodium: 142 mEq/L (ref 135–145)

## 2016-08-02 LAB — HEPATIC FUNCTION PANEL
ALBUMIN: 4.4 g/dL (ref 3.5–5.2)
ALT: 16 U/L (ref 0–35)
AST: 16 U/L (ref 0–37)
Alkaline Phosphatase: 111 U/L (ref 39–117)
Bilirubin, Direct: 0 mg/dL (ref 0.0–0.3)
TOTAL PROTEIN: 7.3 g/dL (ref 6.0–8.3)
Total Bilirubin: 0.4 mg/dL (ref 0.2–1.2)

## 2016-08-02 MED ORDER — BECLOMETHASONE DIPROPIONATE 80 MCG/ACT IN AERS: 2.0000 | INHALATION_SPRAY | Freq: Two times a day (BID) | RESPIRATORY_TRACT | 12 refills | Status: DC

## 2016-08-02 MED ORDER — FUROSEMIDE 20 MG PO TABS: 20.0000 mg | ORAL_TABLET | Freq: Every day | ORAL | 11 refills | Status: DC | PRN

## 2016-08-02 NOTE — Progress Notes (Signed)
Subjective:    Patient ID: Kathleen Ballard, female    DOB: [birth year], 55 y.o.   MRN: [id number]  HPI  Here with Pt denies chest pain, orthopnea, PND,, palpitations, dizziness or syncope except for worsening LE swelling for approx 43moin the setting of 1 mo more frequent inhaler use.  Pt denies new neurological symptoms such as new headache, or facial or extremity weakness or numbness   Pt denies polydipsia, polyuria  Did have recent flu and asthma attacks, has been able to cutback to one inhaler use every 2-3 wks, but now every day for about 1 mo. Does have several wks ongoing nasal allergy symptoms with clearish congestion, itch and sneezing, without fever, pain, ST, cough, swelling or wheezing. Past Medical History:  Diagnosis Date  . Allergy    Seasonal  . Blood transfusion without reported diagnosis   . Cancer ([place]    Stage III breast cancer - in remission  . Depression   . H/O bilateral mastectomy   . PUD (peptic ulcer disease)   . Thyroid disease    Graves' Disease   Past Surgical History:  Procedure Laterality Date  . ABDOMINAL HYSTERECTOMY     Partial - both ovaries remain.  .Marland KitchenBREAST BIOPSY    . CHOLECYSTECTOMY    . ESOPHAGEAL DILATION      reports that she has never smoked. She has never used smokeless tobacco. She reports that she does not drink alcohol or use drugs. family history includes COPD in her maternal grandfather; Cancer in her maternal grandmother; Heart disease in her father and maternal grandmother; Heart disease (age of onset: 774 in her mother. Allergies  Allergen Reactions  . Erythromycin Nausea Only    Makes her have a sick feeling.  .Marland KitchenPenicillins     Was told to never take during allergy testing   Current Outpatient Prescriptions on File Prior to Visit  Medication Sig Dispense Refill  . albuterol (PROVENTIL HFA;VENTOLIN HFA) 108 (90 Base) MCG/ACT inhaler Inhale 2 puffs into the lungs every 6 (six) hours as needed for wheezing or shortness of  breath (cough, shortness of breath or wheezing.). 1 Inhaler 1  . venlafaxine XR (EFFEXOR-XR) 150 MG 24 hr capsule Take 150 mg by mouth 2 (two) times daily.  6  . benzonatate (TESSALON) 100 MG capsule Take 1-2 capsules (100-200 mg total) by mouth 3 (three) times daily as needed for cough. (Patient not taking: Reported on 08/02/2016) 60 capsule 0  . Dextromethorphan-Guaifenesin (MUCINEX DM) 30-600 MG TB12 Take 1 tablet by mouth 2 (two) times daily. (Patient not taking: Reported on 08/02/2016) 28 each 0  . doxycycline (VIBRAMYCIN) 100 MG capsule Take 100 mg by mouth 2 (two) times daily.    . predniSONE (DELTASONE) 20 MG tablet Three tablets daily x 2 days then two tablets daily x 5 days then one tablet daily x 5 days (Patient not taking: Reported on 08/02/2016) 21 tablet 0   No current facility-administered medications on file prior to visit.    Review of Systems  Constitutional: Negative for unusual diaphoresis or night sweats HENT: Negative for ear swelling or discharge Eyes: Negative for worsening visual haziness  Respiratory: Negative for choking and stridor.   Gastrointestinal: Negative for distension or worsening eructation Genitourinary: Negative for retention or change in urine volume.  Musculoskeletal: Negative for other MSK pain or swelling Skin: Negative for color change and worsening wound Neurological: Negative for tremors and numbness other than noted  Psychiatric/Behavioral: Negative for decreased concentration  or agitation other than above       Objective:   Physical Exam BP 118/70 (BP Location: Right Arm, Patient Position: Sitting, Cuff Size: Large)   Pulse (!) 107   Temp 98.3 F (36.8 C) (Oral)   Ht '5\' 6"'$  (1.676 m)   Wt 247 lb (112 kg)   SpO2 98%   BMI 39.87 kg/m  VS noted,  Constitutional: Pt appears in no apparent distress HENT: Head: NCAT.  Right Ear: External ear normal.  Left Ear: External ear normal.  Bilat tm's with mild erythema.  Max sinus areas non tender.   Pharynx with mild erythema, no exudate Eyes: . Pupils are equal, round, and reactive to light. Conjunctivae and EOM are normal Neck: Normal range of motion. Neck supple.  Cardiovascular: Normal rate and regular rhythm.   Pulmonary/Chest: Effort normal and breath sounds decreased without rales or wheezing.  Neurological: Pt is alert. Not confused , motor grossly intact Skin: Skin is warm. No rash, + trace bilat pedal LE edema Psychiatric: Pt behavior is normal. No agitation.   Most recent CXR IMPRESSION: No active cardiopulmonary disease.   Electronically Signed   By: Lahoma Crocker M.D.   On: 02/22/2016 13:00  Most recent echo July 26 2008 SUMMARY - Overall left ventricular systolic function was normal. Left   ventricular ejection fraction was estimated , range being 60   % to 65 %. There were no left ventricular regional wall   motion abnormalities. - No echocardiographic evidence for mitral valve prolapse.  ---------------------------------------------------------------  Prepared and Electronically Authenticated by  Pierre Bali MD Confirmed 27-Jul-2008 16:58:40  Lab Results  Component Value Date   WBC 6.4 02/22/2016   HGB 13.1 02/22/2016   HCT 39.4 02/22/2016   PLT 381 02/22/2016   GLUCOSE 106 (H) 11/03/2014   CHOL 170 11/03/2014   TRIG 104.0 11/03/2014   HDL 55.20 11/03/2014   LDLCALC 94 11/03/2014   NA 141 11/03/2014   K 4.4 11/03/2014   CL 106 11/03/2014   CREATININE 0.9 11/03/2014   BUN 21 11/03/2014   CO2 27 11/03/2014   TSH 0.28 (L) 02/22/2016   HGBA1C 4.8 11/03/2014       Assessment & Plan:

## 2016-08-02 NOTE — Patient Instructions (Signed)
Please take all new medication as prescribed  - the qvar inhaler, and the lasix 20 mg as needed for swelling (daily)  Please continue all other medications as before, and refills have been done if requested.  Please have the pharmacy call with any other refills you may need.  Please keep your appointments with your specialists as you may have planned  Please go to the LAB in the Basement (turn left off the elevator) for the tests to be done today  You will be contacted by phone if any changes need to be made immediately.  Otherwise, you will receive a letter about your results with an explanation, but please check with MyChart first.  Please remember to sign up for MyChart if you have not done so, as this will be important to you in the future with finding out test results, communicating by private email, and scheduling acute appointments online when needed.  Please see your PCP in 1-2 weeks

## 2016-08-02 NOTE — Progress Notes (Signed)
Pre visit review using our clinic review tool, if applicable. No additional management support is needed unless otherwise documented below in the visit note. 

## 2016-08-03 ENCOUNTER — Encounter: Payer: Self-pay | Admitting: Internal Medicine

## 2016-08-03 DIAGNOSIS — R609 Edema, unspecified: Secondary | ICD-10-CM | POA: Insufficient documentation

## 2016-08-03 DIAGNOSIS — J309 Allergic rhinitis, unspecified: Secondary | ICD-10-CM | POA: Insufficient documentation

## 2016-08-03 HISTORY — DX: Allergic rhinitis, unspecified: J30.9

## 2016-08-03 NOTE — Assessment & Plan Note (Signed)
Etiology unclear, declines ecg,echo, for labs as ordered,  to f/u any worsening symptoms or concerns

## 2016-08-03 NOTE — Assessment & Plan Note (Signed)
Mild persistent uncontrolled, to add qvar asd, cont proair prn,  to f/u any worsening symptoms or concerns

## 2016-08-03 NOTE — Assessment & Plan Note (Signed)
Mild to mod, for zyrtec prn otc,  to f/u any worsening symptoms or concerns

## 2017-04-07 ENCOUNTER — Ambulatory Visit (INDEPENDENT_AMBULATORY_CARE_PROVIDER_SITE_OTHER): Payer: BLUE CROSS/BLUE SHIELD | Admitting: Family Medicine

## 2017-04-07 VITALS — BP 131/87 | HR 115 | Temp 98.6°F | Resp 18 | Ht 66.0 in | Wt 233.0 lb

## 2017-04-07 DIAGNOSIS — J45909 Unspecified asthma, uncomplicated: Secondary | ICD-10-CM | POA: Diagnosis not present

## 2017-04-07 DIAGNOSIS — J309 Allergic rhinitis, unspecified: Secondary | ICD-10-CM

## 2017-04-07 MED ORDER — ALBUTEROL SULFATE HFA 108 (90 BASE) MCG/ACT IN AERS: 2.0000 | INHALATION_SPRAY | Freq: Four times a day (QID) | RESPIRATORY_TRACT | 1 refills | Status: DC | PRN

## 2017-04-07 MED ORDER — BUDESONIDE-FORMOTEROL FUMARATE 80-4.5 MCG/ACT IN AERO: 2.0000 | INHALATION_SPRAY | Freq: Two times a day (BID) | RESPIRATORY_TRACT | 1 refills | Status: DC

## 2017-04-07 NOTE — Progress Notes (Signed)
By signing my name below, I, Mesha Guinyard, attest that this documentation has been prepared under the direction and in the presence of Merri Ray, MD.  Electronically Signed: Verlee Monte, Medical Scribe. 04/07/17. 1:24 PM.  Subjective:    Patient ID: Kathleen Ballard, female    DOB: [birth year], 56 y.o.   MRN: [id number]  HPI Chief Complaint  Patient presents with  . Asthma    HPI Comments: Kathleen Ballard is a 56 y.o. female who presents to the Primary Care at [facility] and [facility] complaining of asthma flare for the past 2 week. She was a hx of asthma , treated with qvar and albuterol PRN. She was treated 02/21/17 for bronchial spasms with influenza, with doxycycline and albuterol neb.  Pt is complaint with qvar and her PRN albuterol, but doesn't think her albuterol works since there isn't much coming out. She hasn't had to use albuterol at all, but since her flare she's used it 2-3x a day (last used this morning). Pt reports associated sxs of cough with laughing, sinus pain, and sinus pressure. She doesn't usually have asthma flares with her seasonal allergies but thinks theres has been a change in her asthma flares after getting the flu last year. Pt isn't taking any medication to treat her allergies. Cellulitis a few weeks ago and rx clindamycin for 10 days. Denies rhinorrhea, or postnasal drip.  Patient Active Problem List   Diagnosis Date Noted  . Allergic rhinitis 08/03/2016  . Peripheral edema 08/03/2016  . Asthma with exacerbation 08/02/2016  . 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 Pain in shoulder 08/10/2014   Past Medical History:  Diagnosis Date  . Allergic rhinitis 08/03/2016  . Allergy    Seasonal  . Blood transfusion without reported diagnosis   . Cancer ([place])    Stage III breast cancer - in remission  . Depression   . H/O bilateral mastectomy   . PUD (peptic ulcer disease)   . Thyroid disease    Graves' Disease    Past Surgical History:  Procedure Laterality Date  . ABDOMINAL HYSTERECTOMY     Partial - both ovaries remain.  Marland Kitchen BREAST BIOPSY    . CHOLECYSTECTOMY    . ESOPHAGEAL DILATION     Allergies  Allergen Reactions  . Erythromycin Nausea Only    Makes her have a sick feeling.  Marland Kitchen Penicillins     Was told to never take during allergy testing   Prior to Admission medications   Medication Sig Start Date End Date Taking? Authorizing Provider  albuterol (PROVENTIL HFA;VENTOLIN HFA) 108 (90 Base) MCG/ACT inhaler Inhale 2 puffs into the lungs every 6 (six) hours as needed for wheezing or shortness of breath (cough, shortness of breath or wheezing.). 02/22/16  Yes Wardell Honour, MD  beclomethasone (QVAR) 80 MCG/ACT inhaler Inhale 2 puffs into the lungs 2 (two) times daily. 08/02/16  Yes Biagio Borg, MD  benzonatate (TESSALON) 100 MG capsule Take 1-2 capsules (100-200 mg total) by mouth 3 (three) times daily as needed for cough. Patient not taking: Reported on 08/02/2016 02/22/16   Wardell Honour, MD  Dextromethorphan-Guaifenesin [facility]) 30-600 MG TB12 Take 1 tablet by mouth 2 (two) times daily. Patient not taking: Reported on 08/02/2016 02/22/16   Wardell Honour, MD  doxycycline (VIBRAMYCIN) 100 MG capsule Take 100 mg by mouth 2 (two) times daily.    Historical Provider, MD  furosemide (LASIX) 20 MG tablet Take 1 tablet (  20 mg total) by mouth daily as needed. Patient not taking: Reported on 04/07/2017 08/02/16   Biagio Borg, MD  predniSONE (DELTASONE) 20 MG tablet Three tablets daily x 2 days then two tablets daily x 5 days then one tablet daily x 5 days Patient not taking: Reported on 08/02/2016 02/22/16   Wardell Honour, MD  venlafaxine XR (EFFEXOR-XR) 150 MG 24 hr capsule Take 150 mg by mouth 2 (two) times daily. 09/17/15   Historical Provider, MD   Social History   Social History  . Marital status: Married    Spouse name: [name]  . Number of children: 2  . Years of education: 14   Occupational  History  . preschool [employer]    Social History Main Topics  . Smoking status: Never Smoker  . Smokeless tobacco: Never Used  . Alcohol use No  . Drug use: No  . Sexual activity: Not on file   Other Topics Concern  . Not on file   Social History Narrative   Born and raised in [place], [place]. Currently resides in a private residence with her husband [name]) and daughter. Other daughter lives independently in [place], [place].    Pets - 4 dogs and 1 cat;    Fun: Watch movies and cross-stitch.    Denies religious beliefs that would effect healthcare.    Review of Systems  HENT: Positive for sinus pain and sinus pressure. Negative for postnasal drip and rhinorrhea.   Respiratory: Positive for shortness of breath.   Allergic/Immunologic: Positive for environmental allergies.    Objective:  Physical Exam  Constitutional: She is oriented to person, place, and time. She appears well-developed and well-nourished. No distress.  HENT:  Head: Normocephalic and atraumatic.  Right Ear: Hearing, tympanic membrane, external ear and ear canal normal.  Left Ear: Hearing, tympanic membrane, external ear and ear canal normal.  Nose: Mucosal edema present.  Mouth/Throat: Oropharynx is clear and moist. No oropharyngeal exudate.  Slight edema of the turbinates bilaterally  Eyes: Conjunctivae and EOM are normal. Pupils are equal, round, and reactive to light.  Neck: Neck supple.  Cardiovascular: Normal rate, regular rhythm, normal heart sounds and intact distal pulses.  Exam reveals no gallop and no friction rub.   No murmur heard. Pulmonary/Chest: Effort normal and breath sounds normal. No respiratory distress. She has no wheezes. She has no rhonchi. She has no rales.  Musculoskeletal: She exhibits no edema.  Neurological: She is alert and oriented to person, place, and time.  Skin: Skin is warm and dry. No rash noted.  Psychiatric: She has a normal mood and affect. Her behavior is normal.  Nursing  note and vitals reviewed.   Vitals:   04/07/17 1231  BP: 131/87  Pulse: (!) 115  Resp: 18  Temp: 98.6 F (37 C)  TempSrc: Oral  SpO2: 94%  Weight: 233 lb (105.7 kg)  Height: '5\' 6"'$  (1.676 m)  Body mass index is 37.61 kg/m. Assessment & Plan:    Kathleen Ballard is a 56 y.o. female Persistent asthma without complication, unspecified asthma severity - Plan: budesonide-formoterol (SYMBICORT) 80-4.5 MCG/ACT inhaler, albuterol (PROVENTIL HFA;VENTOLIN HFA) 108 (90 Base) MCG/ACT inhaler  Allergic rhinitis, unspecified chronicity, unspecified seasonality, unspecified trigger  Suspected flare of asthma with some allergy flare as well.   -Change Qvar to Symbicort 80/4.5, 2 puffs twice a day.  - Continue albuterol as needed for breakthrough symptoms. If persistent need of albuterol, consider prednisone taper. RTC precautions.  -As symptoms improve the  next few months, can change back to Qvar. Recheck in 3-6 months to assess asthma control  -Start Flonase, Claritin for allergies.  Meds ordered this encounter  Medications  . budesonide-formoterol (SYMBICORT) 80-4.5 MCG/ACT inhaler    Sig: Inhale 2 puffs into the lungs 2 (two) times daily.    Dispense:  3 Inhaler    Refill:  1  . albuterol (PROVENTIL HFA;VENTOLIN HFA) 108 (90 Base) MCG/ACT inhaler    Sig: Inhale 2 puffs into the lungs every 6 (six) hours as needed for wheezing or shortness of breath (cough, shortness of breath or wheezing.).    Dispense:  1 Inhaler    Refill:  1    Please dispense HFA that insurance prefers   Patient Instructions   Start flonase nasal spray, claritin once per day for allergies.   For asthma, stop Qvar and start Symbicort 2 puffs twice per day.  Continue albuterol if needed, but if your symptoms are not improving within the next 5-7 days, or you require albuterol inhaler more frequently, return for recheck as we may need to start a short course of prednisone again.  As asthma symptoms improved the next  month or 2, you can try restarting Qvar in place of Symbicort.   Follow-up within the next 3-6 months to discuss asthma treatment further. Sooner if worse.   Asthma, Adult Asthma is a recurring condition in which the airways tighten and narrow. Asthma can make it difficult to breathe. It can cause coughing, wheezing, and shortness of breath. Asthma episodes, also called asthma attacks, range from minor to life-threatening. Asthma cannot be cured, but medicines and lifestyle changes can help control it. What are the causes? Asthma is believed to be caused by inherited (genetic) and environmental factors, but its exact cause is unknown. Asthma may be triggered by allergens, lung infections, or irritants in the air. Asthma triggers are different for each person. Common triggers include:  Animal dander.  Dust mites.  Cockroaches.  Pollen from trees or grass.  Mold.  Smoke.  Air pollutants such as dust, household cleaners, hair sprays, aerosol sprays, paint fumes, strong chemicals, or strong odors.  Cold air, weather changes, and winds (which increase molds and pollens in the air).  Strong emotional expressions such as crying or laughing hard.  Stress.  Certain medicines (such as aspirin) or types of drugs (such as beta-blockers).  Sulfites in foods and drinks. Foods and drinks that may contain sulfites include dried fruit, potato chips, and sparkling grape juice.  Infections or inflammatory conditions such as the flu, a cold, or an inflammation of the nasal membranes (rhinitis).  Gastroesophageal reflux disease (GERD).  Exercise or strenuous activity. What are the signs or symptoms? Symptoms may occur immediately after asthma is triggered or many hours later. Symptoms include:  Wheezing.  Excessive nighttime or early morning coughing.  Frequent or severe coughing with a common cold.  Chest tightness.  Shortness of breath. How is this diagnosed? The diagnosis of asthma  is made by a review of your medical history and a physical exam. Tests may also be performed. These may include:  Lung function studies. These tests show how much air you breathe in and out.  Allergy tests.  Imaging tests such as X-rays. How is this treated? Asthma cannot be cured, but it can usually be controlled. Treatment involves identifying and avoiding your asthma triggers. It also involves medicines. There are 2 classes of medicine used for asthma treatment:  Controller medicines. These prevent asthma symptoms  from occurring. They are usually taken every day.  Reliever or rescue medicines. These quickly relieve asthma symptoms. They are used as needed and provide short-term relief. Your health care provider will help you create an asthma action plan. An asthma action plan is a written plan for managing and treating your asthma attacks. It includes a list of your asthma triggers and how they may be avoided. It also includes information on when medicines should be taken and when their dosage should be changed. An action plan may also involve the use of a device called a peak flow meter. A peak flow meter measures how well the lungs are working. It helps you monitor your condition. Follow these instructions at home:  Take medicines only as directed by your health care provider. Speak with your health care provider if you have questions about how or when to take the medicines.  Use a peak flow meter as directed by your health care provider. Record and keep track of readings.  Understand and use the action plan to help minimize or stop an asthma attack without needing to seek medical care.  Control your home environment in the following ways to help prevent asthma attacks:  Do not smoke. Avoid being exposed to secondhand smoke.  Change your heating and air conditioning filter regularly.  Limit your use of fireplaces and wood stoves.  Get rid of pests (such as roaches and mice) and  their droppings.  Throw away plants if you see mold on them.  Clean your floors and dust regularly. Use unscented cleaning products.  Try to have someone else vacuum for you regularly. Stay out of rooms while they are being vacuumed and for a short while afterward. If you vacuum, use a dust mask from a hardware store, a double-layered or microfilter vacuum cleaner bag, or a vacuum cleaner with a HEPA filter.  Replace carpet with wood, tile, or vinyl flooring. Carpet can trap dander and dust.  Use allergy-proof pillows, mattress covers, and box spring covers.  Wash bed sheets and blankets every week in hot water and dry them in a dryer.  Use blankets that are made of polyester or cotton.  Clean bathrooms and kitchens with bleach. If possible, have someone repaint the walls in these rooms with mold-resistant paint. Keep out of the rooms that are being cleaned and painted.  Wash hands frequently. Contact a health care provider if:  You have wheezing, shortness of breath, or a cough even if taking medicine to prevent attacks.  The colored mucus you cough up (sputum) is thicker than usual.  Your sputum changes from clear or white to yellow, green, gray, or bloody.  You have any problems that may be related to the medicines you are taking (such as a rash, itching, swelling, or trouble breathing).  You are using a reliever medicine more than 2-3 times per week.  Your peak flow is still at 50-79% of your personal best after following your action plan for 1 hour.  You have a fever. Get help right away if:  You seem to be getting worse and are unresponsive to treatment during an asthma attack.  You are short of breath even at rest.  You get short of breath when doing very little physical activity.  You have difficulty eating, drinking, or talking due to asthma symptoms.  You develop chest pain.  You develop a fast heartbeat.  You have a bluish color to your lips or  fingernails.  You are light-headed, dizzy,  or faint.  Your peak flow is less than 50% of your personal best. This information is not intended to replace advice given to you by your health care provider. Make sure you discuss any questions you have with your health care provider. Document Released: 12/16/2005 Document Revised: 05/29/2016 Document Reviewed: 07/15/2013 Elsevier Interactive Patient Education  2017 [employer].   IF you received an x-ray today, you will receive an invoice from [facility]. Please contact [facility] at [phone] with questions or concerns regarding your invoice.   IF you received labwork today, you will receive an invoice from [place]. Please contact [facility] at [phone] with questions or concerns regarding your invoice.   Our billing staff will not be able to assist you with questions regarding bills from these companies.  You will be contacted with the lab results as soon as they are available. The fastest way to get your results is to activate your My Chart account. Instructions are located on the last page of this paperwork. If you have not heard from [name] regarding the results in 2 weeks, please contact this office.       I personally performed the services described in this documentation, which was scribed in my presence. The recorded information has been reviewed and considered for accuracy and completeness, addended by me as needed, and agree with information above.  Signed,   Merri Ray, MD Primary Care at [place].  04/07/17 1:56 PM

## 2017-04-07 NOTE — Patient Instructions (Signed)
Start flonase nasal spray, claritin once per day for allergies.   For asthma, stop Qvar and start Symbicort 2 puffs twice per day.  Continue albuterol if needed, but if your symptoms are not improving within the next 5-7 days, or you require albuterol inhaler more frequently, return for recheck as we may need to start a short course of prednisone again.  As asthma symptoms improved the next month or 2, you can try restarting Qvar in place of Symbicort.   Follow-up within the next 3-6 months to discuss asthma treatment further. Sooner if worse.   Asthma, Adult Asthma is a recurring condition in which the airways tighten and narrow. Asthma can make it difficult to breathe. It can cause coughing, wheezing, and shortness of breath. Asthma episodes, also called asthma attacks, range from minor to life-threatening. Asthma cannot be cured, but medicines and lifestyle changes can help control it. What are the causes? Asthma is believed to be caused by inherited (genetic) and environmental factors, but its exact cause is unknown. Asthma may be triggered by allergens, lung infections, or irritants in the air. Asthma triggers are different for each person. Common triggers include:  Animal dander.  Dust mites.  Cockroaches.  Pollen from trees or grass.  Mold.  Smoke.  Air pollutants such as dust, household cleaners, hair sprays, aerosol sprays, paint fumes, strong chemicals, or strong odors.  Cold air, weather changes, and winds (which increase molds and pollens in the air).  Strong emotional expressions such as crying or laughing hard.  Stress.  Certain medicines (such as aspirin) or types of drugs (such as beta-blockers).  Sulfites in foods and drinks. Foods and drinks that may contain sulfites include dried fruit, potato chips, and sparkling grape juice.  Infections or inflammatory conditions such as the flu, a cold, or an inflammation of the nasal membranes  (rhinitis).  Gastroesophageal reflux disease (GERD).  Exercise or strenuous activity. What are the signs or symptoms? Symptoms may occur immediately after asthma is triggered or many hours later. Symptoms include:  Wheezing.  Excessive nighttime or early morning coughing.  Frequent or severe coughing with a common cold.  Chest tightness.  Shortness of breath. How is this diagnosed? The diagnosis of asthma is made by a review of your medical history and a physical exam. Tests may also be performed. These may include:  Lung function studies. These tests show how much air you breathe in and out.  Allergy tests.  Imaging tests such as X-rays. How is this treated? Asthma cannot be cured, but it can usually be controlled. Treatment involves identifying and avoiding your asthma triggers. It also involves medicines. There are 2 classes of medicine used for asthma treatment:  Controller medicines. These prevent asthma symptoms from occurring. They are usually taken every day.  Reliever or rescue medicines. These quickly relieve asthma symptoms. They are used as needed and provide short-term relief. Your health care provider will help you create an asthma action plan. An asthma action plan is a written plan for managing and treating your asthma attacks. It includes a list of your asthma triggers and how they may be avoided. It also includes information on when medicines should be taken and when their dosage should be changed. An action plan may also involve the use of a device called a peak flow meter. A peak flow meter measures how well the lungs are working. It helps you monitor your condition. Follow these instructions at home:  Take medicines only as directed by your  health care provider. Speak with your health care provider if you have questions about how or when to take the medicines.  Use a peak flow meter as directed by your health care provider. Record and keep track of  readings.  Understand and use the action plan to help minimize or stop an asthma attack without needing to seek medical care.  Control your home environment in the following ways to help prevent asthma attacks:  Do not smoke. Avoid being exposed to secondhand smoke.  Change your heating and air conditioning filter regularly.  Limit your use of fireplaces and wood stoves.  Get rid of pests (such as roaches and mice) and their droppings.  Throw away plants if you see mold on them.  Clean your floors and dust regularly. Use unscented cleaning products.  Try to have someone else vacuum for you regularly. Stay out of rooms while they are being vacuumed and for a short while afterward. If you vacuum, use a dust mask from a hardware store, a double-layered or microfilter vacuum cleaner bag, or a vacuum cleaner with a HEPA filter.  Replace carpet with wood, tile, or vinyl flooring. Carpet can trap dander and dust.  Use allergy-proof pillows, mattress covers, and box spring covers.  Wash bed sheets and blankets every week in hot water and dry them in a dryer.  Use blankets that are made of polyester or cotton.  Clean bathrooms and kitchens with bleach. If possible, have someone repaint the walls in these rooms with mold-resistant paint. Keep out of the rooms that are being cleaned and painted.  Wash hands frequently. Contact a health care provider if:  You have wheezing, shortness of breath, or a cough even if taking medicine to prevent attacks.  The colored mucus you cough up (sputum) is thicker than usual.  Your sputum changes from clear or white to yellow, green, gray, or bloody.  You have any problems that may be related to the medicines you are taking (such as a rash, itching, swelling, or trouble breathing).  You are using a reliever medicine more than 2-3 times per week.  Your peak flow is still at 50-79% of your personal best after following your action plan for 1  hour.  You have a fever. Get help right away if:  You seem to be getting worse and are unresponsive to treatment during an asthma attack.  You are short of breath even at rest.  You get short of breath when doing very little physical activity.  You have difficulty eating, drinking, or talking due to asthma symptoms.  You develop chest pain.  You develop a fast heartbeat.  You have a bluish color to your lips or fingernails.  You are light-headed, dizzy, or faint.  Your peak flow is less than 50% of your personal best. This information is not intended to replace advice given to you by your health care provider. Make sure you discuss any questions you have with your health care provider. Document Released: 12/16/2005 Document Revised: 05/29/2016 Document Reviewed: 07/15/2013 Elsevier Interactive Patient Education  2017 [employer].   IF you received an x-ray today, you will receive an invoice from [facility]. Please contact [facility] at [phone] with questions or concerns regarding your invoice.   IF you received labwork today, you will receive an invoice from [place]. Please contact [facility] at [phone] with questions or concerns regarding your invoice.   Our billing staff will not be able to assist you with questions regarding bills  from these companies.  You will be contacted with the lab results as soon as they are available. The fastest way to get your results is to activate your My Chart account. Instructions are located on the last page of this paperwork. If you have not heard from [name] regarding the results in 2 weeks, please contact this office.

## 2017-05-12 ENCOUNTER — Telehealth: Payer: Self-pay | Admitting: Family

## 2017-05-12 NOTE — Telephone Encounter (Signed)
Ok with me 

## 2017-05-12 NOTE — Telephone Encounter (Signed)
Patient would like to switch to the [facility] office.  Patient said her husband and daughter see [name] and she'd like to switch to [place].  Can patient switch?

## 2017-05-12 NOTE — Telephone Encounter (Signed)
Fine with me, thanks. 

## 2017-05-23 ENCOUNTER — Ambulatory Visit (INDEPENDENT_AMBULATORY_CARE_PROVIDER_SITE_OTHER): Payer: BLUE CROSS/BLUE SHIELD | Admitting: Primary Care

## 2017-05-23 ENCOUNTER — Encounter: Payer: Self-pay | Admitting: Primary Care

## 2017-05-23 VITALS — BP 122/70 | HR 102 | Temp 98.3°F | Ht 66.0 in | Wt 234.4 lb

## 2017-05-23 DIAGNOSIS — Z1211 Encounter for screening for malignant neoplasm of colon: Secondary | ICD-10-CM | POA: Diagnosis not present

## 2017-05-23 DIAGNOSIS — J4521 Mild intermittent asthma with (acute) exacerbation: Secondary | ICD-10-CM

## 2017-05-23 DIAGNOSIS — Z0000 Encounter for general adult medical examination without abnormal findings: Secondary | ICD-10-CM | POA: Diagnosis not present

## 2017-05-23 NOTE — Assessment & Plan Note (Signed)
Better control with Symbicort. Using albuterol inhaler infrequently. No wheezing on exam. Continue to monitor.

## 2017-05-23 NOTE — Patient Instructions (Addendum)
Schedule a lab only appointment in the future at your convenience. Ensure you are fasting 4 hours prior to your appointment.  You will be contacted regarding your referral to GI for colonoscopy.  Please let us know if you have not heard back within one week.   Start exercising. You should be getting 150 minutes of moderate intensity exercise weekly.  It's important to improve your diet by increase consumption of fresh vegetables and fruits, whole grains, water.  Ensure you are drinking 64 ounces of water daily.  It was a pleasure to meet you today! Please don't hesitate to call me with any questions. Welcome to [facility]!

## 2017-05-23 NOTE — Assessment & Plan Note (Signed)
Immunizations UTD. Colonoscopy due, pending. Discussed the importance of a healthy diet and regular exercise in order for weight loss, and to reduce the risk of other medical diseases. Exam unremarkable. Labs pending. Form completed. Follow up in 1 year for annual exam.

## 2017-05-23 NOTE — Progress Notes (Signed)
Subjective:    Patient ID: Kathleen Ballard, female    DOB: 1961-05-11, 56 y.o.   MRN: [id number]  HPI  Kathleen Ballard is a 56 year old female who presents today to transfer care from [facility] and for complete physical.  1) Asthma: Currently managed on Symbicort 80-4.5 mcg and albuterol inhaler. She does have history of asthma flares. She uses her albuterol inhaler infrequently on average, more so during seasonal changes. Previously managed on Qvar but was switched to Symbicort with improvement.  2) Generalized Anxiety Disorder: Previoulsy managed on Effexor XR 150 mg when under treatment for breast cancer. She stopped Effexor over 1 year ago. She denies anxiety and depression.   3) Breast Cancer: Diagnosed in 2012. She is been in remission since treatment with chemotherapy, radiation, and Tamoxifen. She underwent double mastectomy.   Immunizations: -Tetanus: Completed in 2013 -Influenza: Did not receive last season -Pneumonia: Has never completed   Diet: She endorses a fair diet. Breakfast: Skips sometimes, yogurt Lunch: Skips sometimes, frozen meal Dinner: Meat, vegetable Snacks: Fruit, chips, crackers Desserts: Once weekly Beverages: Water, sweet tea  Exercise: She doesn't currently exercise Eye exam: Completes annually Dental exam: Has not completed recently. Colonoscopy: Completed over 10 years ago. Pap Smear: Hysterectomy Mammogram: Double mastectomy in 2012.   Review of Systems  Constitutional: Negative for unexpected weight change.  HENT: Negative for rhinorrhea.   Respiratory: Negative for cough and shortness of breath.   Cardiovascular: Negative for chest pain.  Gastrointestinal: Negative for constipation and diarrhea.  Genitourinary: Negative for difficulty urinating and menstrual problem.  Musculoskeletal: Negative for arthralgias and myalgias.  Skin: Negative for rash.  Allergic/Immunologic: Negative for environmental allergies.  Neurological: Negative  for dizziness, numbness and headaches.  Psychiatric/Behavioral:       Doing well off of Effexor       Past Medical History:  Diagnosis Date  . Allergic rhinitis 08/03/2016  . Allergy    Seasonal  . Blood transfusion without reported diagnosis   . Cancer ([place])    Stage III breast cancer - in remission  . Depression   . H/O bilateral mastectomy   . PUD (peptic ulcer disease)   . Thyroid disease    Graves' Disease     Social History   Social History  . Marital status: Married    Spouse name: [name]  . Number of children: 2  . Years of education: 22   Occupational History  . preschool Mudlogger    Social History Main Topics  . Smoking status: Never Smoker  . Smokeless tobacco: Never Used  . Alcohol use No  . Drug use: No  . Sexual activity: Not on file   Other Topics Concern  . Not on file   Social History Narrative   Born and raised in [place], [place]. Currently resides in a private residence with her husband [name]) and daughter. Other daughter lives independently in [place], [place].    Pets - 4 dogs and 1 cat;    Fun: Watch movies and cross-stitch.    Denies religious beliefs that would effect healthcare.     Past Surgical History:  Procedure Laterality Date  . ABDOMINAL HYSTERECTOMY     Partial - both ovaries remain.  Marland Kitchen BREAST BIOPSY    . CHOLECYSTECTOMY    . ESOPHAGEAL DILATION      Family History  Problem Relation Age of Onset  . Heart disease Mother 36       CAD/PTCA  . Heart disease Father  atrrial fibrillation  . Heart disease Maternal Grandmother   . Cancer Maternal Grandmother        Breast cancer  . COPD Maternal Grandfather     Allergies  Allergen Reactions  . Erythromycin Nausea Only    Makes her have a sick feeling.  Marland Kitchen Penicillins     Was told to never take during allergy testing    Current Outpatient Prescriptions on File Prior to Visit  Medication Sig Dispense Refill  . albuterol (PROVENTIL HFA;VENTOLIN HFA) 108 (90 Base)  MCG/ACT inhaler Inhale 2 puffs into the lungs every 6 (six) hours as needed for wheezing or shortness of breath (cough, shortness of breath or wheezing.). 1 Inhaler 1  . budesonide-formoterol (SYMBICORT) 80-4.5 MCG/ACT inhaler Inhale 2 puffs into the lungs 2 (two) times daily. 3 Inhaler 1   No current facility-administered medications on file prior to visit.     BP 122/70   Pulse (!) 102   Temp 98.3 F (36.8 C) (Oral)   Ht 5\' 6"  (1.676 m)   Wt 234 lb 6.4 oz (106.3 kg)   SpO2 98%   BMI 37.83 kg/m    Objective:   Physical Exam  Constitutional: She is oriented to person, place, and time. She appears well-nourished.  HENT:  Right Ear: Tympanic membrane and ear canal normal.  Left Ear: Tympanic membrane and ear canal normal.  Nose: Nose normal.  Mouth/Throat: Oropharynx is clear and moist.  Eyes: Conjunctivae and EOM are normal. Pupils are equal, round, and reactive to light.  Neck: Neck supple. No thyromegaly present.  Cardiovascular: Normal rate and regular rhythm.   No murmur heard. Pulmonary/Chest: Effort normal and breath sounds normal. She has no rales.  Abdominal: Soft. Bowel sounds are normal. There is no tenderness.  Musculoskeletal: Normal range of motion.  Lymphadenopathy:    She has no cervical adenopathy.  Neurological: She is alert and oriented to person, place, and time. She has normal reflexes. No cranial nerve deficit.  Skin: Skin is warm and dry. No rash noted.  Psychiatric: She has a normal mood and affect.          Assessment & Plan:

## 2017-05-28 ENCOUNTER — Telehealth: Payer: Self-pay | Admitting: Family Medicine

## 2017-05-28 NOTE — Telephone Encounter (Signed)
CALL PT TO RESCHEDULE HER APPOINTMENT THAT SHE HAD WITH [facility] ON 07-03-17 SHE DIDN'T WANT TO RESCHEDULE AT THE TIME

## 2017-07-03 ENCOUNTER — Ambulatory Visit: Payer: BLUE CROSS/BLUE SHIELD | Admitting: Family Medicine

## 2017-08-01 ENCOUNTER — Encounter: Payer: Self-pay | Admitting: Primary Care

## 2018-01-13 ENCOUNTER — Ambulatory Visit: Payer: Self-pay | Admitting: *Deleted

## 2018-01-13 NOTE — Telephone Encounter (Signed)
Pt has 30' appt with [name] NP on 01/14/18.

## 2018-01-13 NOTE — Telephone Encounter (Signed)
Patient has been having back pain this week end- she has had this for years. She is having dizziness - she has been having headaches. Patient noticed a ringing in her head this week end- she states it went away and has come back. Ringing seems to be in the middle of her head. ( Buzzing sound)  Reason for Disposition . [1] MILD dizziness (e.g., walking normally) AND [2] has NOT been evaluated by physician for this  (Exception: dizziness caused by heat exposure, sudden standing, or poor fluid intake)  Answer Assessment - Initial Assessment Questions 1. DESCRIPTION: "Describe your dizziness."     Off balance 2. LIGHTHEADED: "Do you feel lightheaded?" (e.g., somewhat faint, woozy, weak upon standing)     Not worse with standing- not faint- weak and tired 3. VERTIGO: "Do you feel like either you or the room is spinning or tilting?" (i.e. vertigo)     no 4. SEVERITY: "How bad is it?"  "Do you feel like you are going to faint?" "Can you stand and walk?"   - MILD - walking normally   - MODERATE - interferes with normal activities (e.g., work, school)    - SEVERE - unable to stand, requires support to walk, feels like passing out now.      Mild- enough to be annoying and know that it is there 5. ONSET:  "When did the dizziness begin?"     This morning in the shower- 6;30 6. AGGRAVATING FACTORS: "Does anything make it worse?" (e.g., standing, change in head position)     No- patient did take Ibuprofen- patient does get a lot of headaches and did get a lot of headaches  Over the week end 7. HEART RATE: "Can you tell me your heart rate?" "How many beats in 15 seconds?"  (Note: not all patients can do this)       BP over the week end was normal- P was normal- 87 8. CAUSE: "What do you think is causing the dizziness?"     Fatigue possible 9. RECURRENT SYMPTOM: "Have you had dizziness before?" If so, ask: "When was the last time?" "What happened that time?"     Not specifically 10. OTHER SYMPTOMS: "Do  you have any other symptoms?" (e.g., fever, chest pain, vomiting, diarrhea, bleeding)       no 11. PREGNANCY: "Is there any chance you are pregnant?" "When was your last menstrual period?"       n/a  Protocols used: DIZZINESS [facility]

## 2018-01-13 NOTE — Telephone Encounter (Signed)
Noted  

## 2018-01-14 ENCOUNTER — Ambulatory Visit: Payer: 59 | Admitting: Primary Care

## 2018-01-14 ENCOUNTER — Encounter: Payer: Self-pay | Admitting: Primary Care

## 2018-01-14 VITALS — BP 132/84 | HR 96 | Temp 98.0°F | Wt 250.8 lb

## 2018-01-14 DIAGNOSIS — R42 Dizziness and giddiness: Secondary | ICD-10-CM

## 2018-01-14 LAB — CBC
HCT: 40.1 % (ref 36.0–46.0)
HEMOGLOBIN: 13.3 g/dL (ref 12.0–15.0)
MCHC: 33.2 g/dL (ref 30.0–36.0)
MCV: 86.3 fl (ref 78.0–100.0)
PLATELETS: 187 10*3/uL (ref 150.0–400.0)
RBC: 4.65 Mil/uL (ref 3.87–5.11)
RDW: 14 % (ref 11.5–15.5)
WBC: 4.8 10*3/uL (ref 4.0–10.5)

## 2018-01-14 LAB — BASIC METABOLIC PANEL
BUN: 14 mg/dL (ref 6–23)
CHLORIDE: 104 meq/L (ref 96–112)
CO2: 29 meq/L (ref 19–32)
CREATININE: 0.75 mg/dL (ref 0.40–1.20)
Calcium: 9.1 mg/dL (ref 8.4–10.5)
GFR: 85.01 mL/min (ref >60.00)
GLUCOSE: 109 mg/dL — AB (ref 70–99)
POTASSIUM: 4 meq/L (ref 3.5–5.1)
Sodium: 141 mEq/L (ref 135–145)

## 2018-01-14 LAB — TSH: TSH: 0.6 u[IU]/mL (ref 0.35–4.50)

## 2018-01-14 NOTE — Progress Notes (Signed)
Subjective:    Patient ID: Kathleen Ballard, female    DOB: 10-30-1961, 57 y.o.   MRN: [id number]  HPI  Ms. Killam is a 57 year old female with a history of asthma and allergic rhinitis who presents today with a chief complaint of feeling lightheaded.   She's experienced a one week history of "buzzing" in her head, not ears. Yesterday she started to feel "lightheaded" without dizziness or sensations of the room spinning. No episodes of "lightheadedness" today. She also feels fatigued, feeling more tired, increased muscle cramps to neck anterior chest which is chronic but more intense lately. She has noticed increased GERD symptoms and is taking Zantac OTC with improvement.  She denies increased headache, does have chronic headaches. She denies changes in vision, nausea, vomiting, diarrhea, ear pain, chest pain, shortness of breath. She's noticed minor sinus pressure. She's eating and drinking without difficulty. She drinks little water, mostly soda.   She's checking her blood pressure infrequently at home with her last reading was 127/80.   Review of Systems  Constitutional: Positive for fatigue.  HENT: Negative for congestion and sinus pressure.   Respiratory: Negative for cough, shortness of breath and wheezing.   Cardiovascular: Negative for chest pain and palpitations.  Gastrointestinal: Negative for diarrhea, nausea and vomiting.  Musculoskeletal: Positive for myalgias.  Neurological: Positive for light-headedness and headaches.       Past Medical History:  Diagnosis Date  . Allergic rhinitis 08/03/2016  . Allergy    Seasonal  . Blood transfusion without reported diagnosis   . Cancer ([place])    Stage III breast cancer - in remission  . Depression   . H/O bilateral mastectomy   . PUD (peptic ulcer disease)   . Thyroid disease    Graves' Disease     Social History   Socioeconomic History  . Marital status: Married    Spouse name: [name]  . Number of children: 2  .  Years of education: 32  . Highest education level: Not on file  Social Needs  . Financial resource strain: Not on file  . Food insecurity - worry: Not on file  . Food insecurity - inability: Not on file  . Transportation needs - medical: Not on file  . Transportation needs - non-medical: Not on file  Occupational History  . Occupation: Biomedical engineer  Tobacco Use  . Smoking status: Never Smoker  . Smokeless tobacco: Never Used  Substance and Sexual Activity  . Alcohol use: No    Alcohol/week: 0.0 oz  . Drug use: No  . Sexual activity: Not on file  Other Topics Concern  . Not on file  Social History Narrative   Born and raised in [place], [place]. Currently resides in a private residence with her husband [name]) and daughter. Other daughter lives independently in [place], [place].    Pets - 4 dogs and 1 cat;    Fun: Watch movies and cross-stitch.    Denies religious beliefs that would effect healthcare.     Past Surgical History:  Procedure Laterality Date  . ABDOMINAL HYSTERECTOMY     Partial - both ovaries remain.  Marland Kitchen BREAST BIOPSY    . CHOLECYSTECTOMY    . ESOPHAGEAL DILATION      Family History  Problem Relation Age of Onset  . Heart disease Mother 41       CAD/PTCA  . Heart disease Father        atrrial fibrillation  . Heart disease Maternal Grandmother   .  Cancer Maternal Grandmother        Breast cancer  . COPD Maternal Grandfather     Allergies  Allergen Reactions  . Erythromycin Nausea Only    Makes her have a sick feeling.  Marland Kitchen Penicillins     Was told to never take during allergy testing    Current Outpatient Medications on File Prior to Visit  Medication Sig Dispense Refill  . albuterol (PROVENTIL HFA;VENTOLIN HFA) 108 (90 Base) MCG/ACT inhaler Inhale 2 puffs into the lungs every 6 (six) hours as needed for wheezing or shortness of breath (cough, shortness of breath or wheezing.). 1 Inhaler 1  . budesonide-formoterol (SYMBICORT) 80-4.5 MCG/ACT inhaler  Inhale 2 puffs into the lungs 2 (two) times daily. 3 Inhaler 1   No current facility-administered medications on file prior to visit.     BP 132/84 (BP Location: Right Arm, Patient Position: Sitting, Cuff Size: Large)   Pulse 96   Temp 98 F (36.7 C) (Oral)   Wt 250 lb 12 oz (113.7 kg)   SpO2 97%   BMI 40.47 kg/m    Objective:   Physical Exam  Constitutional: She is oriented to person, place, and time. She appears well-nourished.  Eyes: EOM are normal. Pupils are equal, round, and reactive to light.  Neck: Neck supple.  Cardiovascular: Normal rate and regular rhythm.  Pulmonary/Chest: Effort normal and breath sounds normal.  Neurological: She is alert and oriented to person, place, and time. No cranial nerve deficit. Coordination normal.  Skin: Skin is warm and dry.  Psychiatric: She has a normal mood and affect.          Assessment & Plan:  Lightheadedness:  Occurred several times yesterday, no symptoms today. Do suspect dehydration as part of her symptoms. Orthostatic vitals: overall stable except for a drop in HR from 87 to 57 from sitting to standing. ECG today: NSR with rate of 81, no T-wave conversion, no ST-elevation or depression. No prior ECG to compare. Will have her increase water intake, limit soda. Will also have her start monitoring her BP and report readings at or above 135/90. Labs pending, check CBC, BMP, Return precautions provided.  Sheral Flow, NP

## 2018-01-14 NOTE — Patient Instructions (Signed)
Stop by the lab prior to leaving today. I will notify you of your results once received.   Nasal Congestion/Ear Pressure: Try using Flonase (fluticasone) nasal spray. Instill 1 spray in each nostril twice daily.   Ensure you are consuming 64 ounces of water daily.  Start monitoring your blood pressure several times weekly, report readings at or above 135/90.   It was a pleasure to see you today!

## 2018-01-15 ENCOUNTER — Other Ambulatory Visit (INDEPENDENT_AMBULATORY_CARE_PROVIDER_SITE_OTHER): Payer: 59

## 2018-01-15 ENCOUNTER — Encounter: Payer: Self-pay | Admitting: Primary Care

## 2018-01-15 DIAGNOSIS — R7309 Other abnormal glucose: Secondary | ICD-10-CM

## 2018-01-15 LAB — HEMOGLOBIN A1C: Hgb A1c MFr Bld: 5.4 % (ref 4.6–6.5)

## 2018-08-16 DIAGNOSIS — Z853 Personal history of malignant neoplasm of breast: Secondary | ICD-10-CM

## 2018-08-18 ENCOUNTER — Encounter: Payer: Self-pay | Admitting: Hematology and Oncology

## 2018-08-18 ENCOUNTER — Telehealth: Payer: Self-pay | Admitting: Hematology and Oncology

## 2018-08-18 NOTE — Telephone Encounter (Signed)
New referral received from Dr. Carlis Abbott for hx of breast cancer. Pt has been scheduled to see Dr. Lindi Adie on 9/23 at 345pm. Pt aware to arrive 40 minutes early. Letter mailed to the patient.

## 2018-09-21 ENCOUNTER — Telehealth: Payer: Self-pay | Admitting: Hematology and Oncology

## 2018-09-21 ENCOUNTER — Ambulatory Visit (HOSPITAL_COMMUNITY)
Admission: RE | Admit: 2018-09-21 | Discharge: 2018-09-21 | Disposition: A | Discharge status: Home / Self Care | Payer: 59 | Source: Ambulatory Visit | Attending: Hematology and Oncology | Admitting: Hematology and Oncology

## 2018-09-21 ENCOUNTER — Inpatient Hospital Stay: Payer: 59 | Attending: Hematology and Oncology | Admitting: Hematology and Oncology

## 2018-09-21 VITALS — BP 137/89 | HR 108 | Temp 97.7°F | Resp 18 | Ht 66.0 in | Wt 251.1 lb

## 2018-09-21 DIAGNOSIS — E119 Type 2 diabetes mellitus without complications: Secondary | ICD-10-CM | POA: Insufficient documentation

## 2018-09-21 DIAGNOSIS — Z171 Estrogen receptor negative status [ER-]: Secondary | ICD-10-CM | POA: Diagnosis not present

## 2018-09-21 DIAGNOSIS — Z9221 Personal history of antineoplastic chemotherapy: Secondary | ICD-10-CM

## 2018-09-21 DIAGNOSIS — Z923 Personal history of irradiation: Secondary | ICD-10-CM

## 2018-09-21 DIAGNOSIS — I10 Essential (primary) hypertension: Secondary | ICD-10-CM | POA: Diagnosis not present

## 2018-09-21 DIAGNOSIS — C3432 Malignant neoplasm of lower lobe, left bronchus or lung: Secondary | ICD-10-CM | POA: Insufficient documentation

## 2018-09-21 DIAGNOSIS — C50812 Malignant neoplasm of overlapping sites of left female breast: Secondary | ICD-10-CM | POA: Diagnosis not present

## 2018-09-21 DIAGNOSIS — C50811 Malignant neoplasm of overlapping sites of right female breast: Secondary | ICD-10-CM | POA: Diagnosis not present

## 2018-09-21 DIAGNOSIS — M79642 Pain in left hand: Secondary | ICD-10-CM

## 2018-09-21 DIAGNOSIS — Z170 Estrogen receptor positive status [ER+]: Secondary | ICD-10-CM

## 2018-09-21 DIAGNOSIS — Z9013 Acquired absence of bilateral breasts and nipples: Secondary | ICD-10-CM

## 2018-09-21 MED ORDER — VENLAFAXINE HCL ER 37.5 MG PO CP24: 37.5000 mg | ORAL_CAPSULE | Freq: Every day | ORAL | 5 refills | Status: DC

## 2018-09-21 NOTE — Telephone Encounter (Signed)
Gave pt avs and calendar  °

## 2018-09-21 NOTE — Progress Notes (Signed)
[facility] CONSULT NOTE  Patient Care Team: Pleas [name], NP as PCP - General (Internal Medicine)  CHIEF COMPLAINTS/PURPOSE OF CONSULTATION:  Previous history of breast cancer here for assumption of care  HISTORY OF PRESENTING ILLNESS:  Kathleen Ballard 57 y.o. female is here because of a prior history of bilateral breast cancers.  Patient felt a lump in the left breast which led to mammograms and ultrasounds.  She had bilateral mastectomies.  Left breast revealed stage IIIc breast cancer and right breast revealed a stage Ia breast cancer.  Both of them were ER negative but PR slightly positive with 20% HER-2 positive.  She then underwent dose dense Adriamycin and Cytoxan followed by Taxol and Herceptin.  She completed 1 year of Herceptin treatment and was started on antiestrogen therapy after finishing with radiation.  She could not tolerate antiestrogen therapy because of severe adverse effects including hot flashes and myalgias. She was seeing physicians at [facility] but decided to come to see [name] for her oncology care going further because her job had moved to [place].  Her biggest complaint today is a pain on the medial aspect of the palm.  I reviewed her records extensively and collaborated the history with the patient.  SUMMARY OF ONCOLOGIC HISTORY:   Overlapping malignant neoplasm of female breast ([place])   09/2011 Initial Diagnosis    Palpable left breast mass status post bilateral mastectomies: Left: 5 cm IDC T3N3 (supraclavicular lymph node involvement) ER 0%, PR 20%, HER-2 positive ratio 2.9 there was extension into anterior and posterior margin, 3/5 lymph nodes positive; right: Incidentally discovered 0.8 cm grade 1 IDC with DCIS no prognostic panel done, 0/9 lymph nodes negative    11/27/2011 - 04/03/2012 Chemotherapy    Dose dense Adriamycin and Cytoxan followed by Taxol Herceptin, Herceptin maintenance completed 02/05/2013     03/2012 - 04/2012 Radiation Therapy   Adjuvant XRT    06/21/2012 - 06/21/2014 Anti-estrogen oral therapy    Could not tolerate multiple antiestrogen therapy options.    MEDICAL HISTORY:  Past Medical History:  Diagnosis Date  . Allergic rhinitis 08/03/2016  . Allergy    Seasonal  . Blood transfusion without reported diagnosis   . Cancer ([place])    Stage III breast cancer - in remission  . Depression   . H/O bilateral mastectomy   . PUD (peptic ulcer disease)   . Thyroid disease    Graves' Disease    SURGICAL HISTORY: Past Surgical History:  Procedure Laterality Date  . ABDOMINAL HYSTERECTOMY     Partial - both ovaries remain.  Marland Kitchen BREAST BIOPSY    . CHOLECYSTECTOMY    . ESOPHAGEAL DILATION      SOCIAL HISTORY: Social History   Socioeconomic History  . Marital status: Married    Spouse name: [name]  . Number of children: 2  . Years of education: 6  . Highest education level: Not on file  Occupational History  . Occupation: Biomedical engineer  Social Needs  . Financial resource strain: Not on file  . Food insecurity:    Worry: Not on file    Inability: Not on file  . Transportation needs:    Medical: Not on file    Non-medical: Not on file  Tobacco Use  . Smoking status: Never Smoker  . Smokeless tobacco: Never Used  Substance and Sexual Activity  . Alcohol use: No    Alcohol/week: 0.0 standard drinks  . Drug use: No  . Sexual activity: Not on  file  Lifestyle  . Physical activity:    Days per week: Not on file    Minutes per session: Not on file  . Stress: Not on file  Relationships  . Social connections:    Talks on phone: Not on file    Gets together: Not on file    Attends religious service: Not on file    Active member of club or organization: Not on file    Attends meetings of clubs or organizations: Not on file    Relationship status: Not on file  . Intimate partner violence:    Fear of current or ex partner: Not on file    Emotionally abused: Not on file    Physically abused: Not on  file    Forced sexual activity: Not on file  Other Topics Concern  . Not on file  Social History Narrative   Born and raised in [place], [place]. Currently resides in a private residence with her husband [name]) and daughter. Other daughter lives independently in [place], [place].    Pets - 4 dogs and 1 cat;    Fun: Watch movies and cross-stitch.    Denies religious beliefs that would effect healthcare.     FAMILY HISTORY: Family History  Problem Relation Age of Onset  . Heart disease Mother 62       CAD/PTCA  . Heart disease Father        atrrial fibrillation  . Heart disease Maternal Grandmother   . Cancer Maternal Grandmother        Breast cancer  . COPD Maternal Grandfather     ALLERGIES:  is allergic to erythromycin and penicillins.  MEDICATIONS:  Current Outpatient Medications  Medication Sig Dispense Refill  . albuterol (PROVENTIL HFA;VENTOLIN HFA) 108 (90 Base) MCG/ACT inhaler Inhale 2 puffs into the lungs every 6 (six) hours as needed for wheezing or shortness of breath (cough, shortness of breath or wheezing.). 1 Inhaler 1  . budesonide-formoterol (SYMBICORT) 80-4.5 MCG/ACT inhaler Inhale 2 puffs into the lungs 2 (two) times daily. 3 Inhaler 1  . venlafaxine XR (EFFEXOR-XR) 37.5 MG 24 hr capsule Take 1 capsule (37.5 mg total) by mouth daily with breakfast. 30 capsule 5   No current facility-administered medications for this visit.     REVIEW OF SYSTEMS:   Constitutional: Denies fevers, chills or abnormal night sweats Eyes: Denies blurriness of vision, double vision or watery eyes Ears, nose, mouth, throat, and face: Denies mucositis or sore throat Respiratory: Denies cough, dyspnea or wheezes Cardiovascular: Denies palpitation, chest discomfort or lower extremity swelling Gastrointestinal:  Denies nausea, heartburn or change in bowel habits Skin: Denies abnormal skin rashes Lymphatics: Denies new lymphadenopathy or easy bruising Neurological:Denies numbness, tingling  or new weaknesses Behavioral/Psych: Mood is stable, no new changes  Breast: Denies any lumps or nodules in the chest wall but she is complaining of her left lower quadrant rib cage pain that is causing shooting sensation for the past 3 weeks. All other systems were reviewed with the patient and are negative.  PHYSICAL EXAMINATION: ECOG PERFORMANCE STATUS: 1 - Symptomatic but completely ambulatory  Vitals:   09/21/18 1530  BP: 137/89  Pulse: (!) 108  Resp: 18  Temp: 97.7 F (36.5 C)  SpO2: 97%   Filed Weights   09/21/18 1530  Weight: 251 lb 1.6 oz (113.9 kg)    GENERAL:alert, no distress and comfortable SKIN: skin color, texture, turgor are normal, no rashes or significant lesions EYES: normal, conjunctiva are pink and non-injected,  sclera clear OROPHARYNX:no exudate, no erythema and lips, buccal mucosa, and tongue normal  NECK: supple, thyroid normal size, non-tender, without nodularity LYMPH:  no palpable lymphadenopathy in the cervical, axillary or inguinal LUNGS: clear to auscultation and percussion with normal breathing effort HEART: regular rate & rhythm and no murmurs and no lower extremity edema ABDOMEN:abdomen soft, non-tender and normal bowel sounds Musculoskeletal:no cyanosis of digits and no clubbing  PSYCH: alert & oriented x 3 with fluent speech NEURO: no focal motor/sensory deficits BREAST: No palpable nodules in breast. No palpable axillary or supraclavicular lymphadenopathy (exam performed in the presence of a chaperone)   LABORATORY DATA:  I have reviewed the data as listed Lab Results  Component Value Date   WBC 4.8 01/14/2018   HGB 13.3 01/14/2018   HCT 40.1 01/14/2018   MCV 86.3 01/14/2018   PLT 187.0 01/14/2018   Lab Results  Component Value Date   NA 141 01/14/2018   K 4.0 01/14/2018   CL 104 01/14/2018   CO2 29 01/14/2018    RADIOGRAPHIC STUDIES: I have personally reviewed the radiological reports and agreed with the findings in the  report.  ASSESSMENT AND PLAN:  Overlapping malignant neoplasm of female breast ([place]) Bilateral breast cancers Left: T3 N3 M0 stage III left IDC with questionable positive margins, 3/5 lymph nodes positive along with supraclavicular lymph node.  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  We will get an x-ray of her hand will determine what to do whether she needs an orthopedic consult.  I will call her with results of the x-ray of the hand. Otherwise we plan to see her back in 1 year for surveillance checks and follow-up. Breast exam 09/21/2018: Postmastectomy  Chest wall is without any palpable lumps or nodules.   All questions were answered. The patient knows to call the clinic with any problems, questions or concerns.    Harriette Ohara, MD 09/21/18

## 2018-09-21 NOTE — Assessment & Plan Note (Signed)
Bilateral breast cancers Left: T3 N3 M0 stage III left IDC with questionable positive margins, 3/5 lymph nodes positive along with supraclavicular lymph node.  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  We will get an x-ray of her hand will determine what to do whether she needs an orthopedic consult.  I will call her with results of the x-ray of the hand. Otherwise we plan to see her back in 1 year for surveillance checks and follow-up. Breast exam 09/21/2018: Postmastectomy  Chest wall is without any palpable lumps or nodules.

## 2018-09-22 ENCOUNTER — Telehealth: Payer: Self-pay | Admitting: Hematology and Oncology

## 2018-09-22 NOTE — Telephone Encounter (Signed)
I informed the patient that the x-ray of the hand is normal. She will use conservative measures on her palm to see if it can make the pain go away.  If not we may have to refer her to orthopedics.

## 2018-10-20 ENCOUNTER — Other Ambulatory Visit: Payer: 59

## 2018-10-20 ENCOUNTER — Encounter: Payer: 59 | Admitting: Genetics

## 2019-01-07 ENCOUNTER — Ambulatory Visit: Payer: 59 | Admitting: Primary Care

## 2019-03-12 ENCOUNTER — Ambulatory Visit
Admission: EM | Admit: 2019-03-12 | Discharge: 2019-03-12 | Disposition: A | Discharge status: Home / Self Care | Payer: 59 | Attending: Physician Assistant | Admitting: Physician Assistant

## 2019-03-12 DIAGNOSIS — R05 Cough: Secondary | ICD-10-CM

## 2019-03-12 DIAGNOSIS — R059 Cough, unspecified: Secondary | ICD-10-CM

## 2019-03-12 DIAGNOSIS — J45909 Unspecified asthma, uncomplicated: Secondary | ICD-10-CM | POA: Diagnosis not present

## 2019-03-12 MED ORDER — FLUTICASONE PROPIONATE 50 MCG/ACT NA SUSP: 2.0000 | Freq: Every day | NASAL | 0 refills | Status: DC

## 2019-03-12 MED ORDER — BUDESONIDE-FORMOTEROL FUMARATE 80-4.5 MCG/ACT IN AERO: 2.0000 | INHALATION_SPRAY | Freq: Two times a day (BID) | RESPIRATORY_TRACT | 1 refills | Status: DC

## 2019-03-12 MED ORDER — ALBUTEROL SULFATE HFA 108 (90 BASE) MCG/ACT IN AERS: 2.0000 | INHALATION_SPRAY | Freq: Four times a day (QID) | RESPIRATORY_TRACT | 1 refills | Status: DC | PRN

## 2019-03-12 NOTE — ED Provider Notes (Signed)
[facility]    CSN: [id number] Arrival date & time: 03/12/19  1014     History   Chief Complaint Chief Complaint  Patient presents with  . Cough    HPI Kathleen Ballard is a 58 y.o. female.   58 year old female with history of breast cancer in remission, allergic rhinitis, asthma, thyroid disease comes in for 1 day history of cough.  Patient states started coughing last night which was nonproductive.  At that time, felt that inhaler would have relieved some of the cough, but realizes inhaler has expired.  She has mild rhinorrhea without nasal congestion, sore throat.  Denies fever, chills, night sweats.  Denies chest pain, shortness of breath, wheezing, palpitations.  Never smoker.  Positive sick contact.  Patient was also on Symbicort but ran out.  She has not used Symbicort or albuterol in 1 to 2 years.  Has not needed prednisone for 1 to 2 years.     Past Medical History:  Diagnosis Date  . Allergic rhinitis 08/03/2016  . Allergy    Seasonal  . Blood transfusion without reported diagnosis   . Cancer ([place])    Stage III breast cancer - in remission  . Depression   . H/O bilateral mastectomy   . PUD (peptic ulcer disease)   . Thyroid disease    Graves' Disease    Patient Active Problem List   Diagnosis Date Noted  . Allergic rhinitis 08/03/2016  . Peripheral edema 08/03/2016  . Asthma with exacerbation 08/02/2016  . Overlapping malignant neoplasm of female breast ([place]) 10/23/2015  . Preventative health care 11/03/2014  . Pain in shoulder 08/10/2014    Past Surgical History:  Procedure Laterality Date  . ABDOMINAL HYSTERECTOMY     Partial - both ovaries remain.  Marland Kitchen BREAST BIOPSY    . CHOLECYSTECTOMY    . ESOPHAGEAL DILATION      OB History   No obstetric history on file.      Home Medications    Prior to Admission medications   Medication Sig Start Date End Date Taking? Authorizing Provider  albuterol (PROVENTIL HFA;VENTOLIN HFA) 108 (90  Base) MCG/ACT inhaler Inhale 2 puffs into the lungs every 6 (six) hours as needed for wheezing or shortness of breath (cough, shortness of breath or wheezing.). 03/12/19   Tasia Catchings, Amy V, PA-C  budesonide-formoterol (SYMBICORT) 80-4.5 MCG/ACT inhaler Inhale 2 puffs into the lungs 2 (two) times daily. 03/12/19   Tasia Catchings, Amy V, PA-C  fluticasone (FLONASE) 50 MCG/ACT nasal spray Place 2 sprays into both nostrils daily. 03/12/19   Ok Edwards, PA-C    Family History Family History  Problem Relation Age of Onset  . Heart disease Mother 26       CAD/PTCA  . Heart disease Father        atrrial fibrillation  . Heart disease Maternal Grandmother   . Cancer Maternal Grandmother        Breast cancer  . COPD Maternal Grandfather     Social History Social History   Tobacco Use  . Smoking status: Never Smoker  . Smokeless tobacco: Never Used  Substance Use Topics  . Alcohol use: No    Alcohol/week: 0.0 standard drinks  . Drug use: No     Allergies   Erythromycin and Penicillins   Review of Systems Review of Systems  Reason unable to perform ROS: See HPI as above.     Physical Exam Triage Vital Signs ED Triage Vitals  Enc Vitals Group  BP 03/12/19 1024 (!) 145/81     Pulse Rate 03/12/19 1024 100     Resp 03/12/19 1024 18     Temp 03/12/19 1024 98.4 F (36.9 C)     Temp Source 03/12/19 1024 Oral     SpO2 03/12/19 1024 96 %     Weight --      Height --      Head Circumference --      Peak Flow --      Pain Score 03/12/19 1025 0     Pain Loc --      Pain Edu? --      Excl. in [place]? --    No data found.  Updated Vital Signs BP (!) 145/81 (BP Location: Right Arm)   Pulse 100   Temp 98.4 F (36.9 C) (Oral)   Resp 18   SpO2 96%   Visual Acuity Right Eye Distance:   Left Eye Distance:   Bilateral Distance:    Right Eye Near:   Left Eye Near:    Bilateral Near:     Physical Exam Constitutional:      General: She is not in acute distress.    Appearance: She is  well-developed. She is not ill-appearing, toxic-appearing or diaphoretic.  HENT:     Head: Normocephalic and atraumatic.     Right Ear: Tympanic membrane, ear canal and external ear normal. Tympanic membrane is not erythematous or bulging.     Left Ear: Tympanic membrane, ear canal and external ear normal. Tympanic membrane is not erythematous or bulging.     Nose: Nose normal.     Right Sinus: No maxillary sinus tenderness or frontal sinus tenderness.     Left Sinus: No maxillary sinus tenderness or frontal sinus tenderness.     Mouth/Throat:     Mouth: Mucous membranes are moist.     Pharynx: Oropharynx is clear. Uvula midline.  Eyes:     Conjunctiva/sclera: Conjunctivae normal.     Pupils: Pupils are equal, round, and reactive to light.  Neck:     Musculoskeletal: Normal range of motion and neck supple.  Cardiovascular:     Rate and Rhythm: Normal rate and regular rhythm.     Heart sounds: Normal heart sounds. No murmur. No friction rub. No gallop.   Pulmonary:     Effort: Pulmonary effort is normal. No accessory muscle usage, prolonged expiration, respiratory distress or retractions.     Breath sounds: Normal breath sounds. No stridor, decreased air movement or transmitted upper airway sounds. No decreased breath sounds, wheezing, rhonchi or rales.  Skin:    General: Skin is warm and dry.  Neurological:     Mental Status: She is alert and oriented to person, place, and time.      UC Treatments / Results  Labs (all labs ordered are listed, but only abnormal results are displayed) Labs Reviewed - No data to display  EKG None  Radiology No results found.  Procedures Procedures (including critical care time)  Medications Ordered in UC Medications - No data to display  Initial Impression / Assessment and Plan / UC Course  I have reviewed the triage vital signs and the nursing notes.  Pertinent labs & imaging results that were available during my care of the patient  were reviewed by me and considered in my medical decision making (see chart for details).  Will refill albuterol and Symbicort.  Discussed with patient, no wheezing on exam today.  Can hold off Symbicort  at this time given well-controlled asthma.  Other symptomatic treatment discussed.  Push fluids.  Return precautions given.  Patient expresses understanding and agrees to plan.  Final Clinical Impressions(s) / UC Diagnoses   Final diagnoses:  Cough    ED Prescriptions    Medication Sig Dispense Auth. Provider   albuterol (PROVENTIL HFA;VENTOLIN HFA) 108 (90 Base) MCG/ACT inhaler Inhale 2 puffs into the lungs every 6 (six) hours as needed for wheezing or shortness of breath (cough, shortness of breath or wheezing.). 1 Inhaler Yu, Amy V, PA-C   budesonide-formoterol (SYMBICORT) 80-4.5 MCG/ACT inhaler Inhale 2 puffs into the lungs 2 (two) times daily. 1 Inhaler Yu, Amy V, PA-C   fluticasone (FLONASE) 50 MCG/ACT nasal spray Place 2 sprays into both nostrils daily. 1 g Tobin Chad, [place] 03/12/19 1044

## 2019-03-12 NOTE — ED Triage Notes (Signed)
Pt states developed a cough yesterday, hx of asthma, out of inhaler

## 2019-03-12 NOTE — Discharge Instructions (Signed)
Symbicort and albuterol refilled. Start flonase, atrovent nasal spray for nasal congestion/drainage. You can use over the counter nasal saline rinse such as neti pot for nasal congestion. Keep hydrated, your urine should be clear to pale yellow in color. Tylenol/motrin for fever and pain. Monitor for any worsening of symptoms, chest pain, shortness of breath, wheezing, swelling of the throat, follow up for reevaluation.   For sore throat/cough try using a honey-based tea. Use 3 teaspoons of honey with juice squeezed from half lemon. Place shaved pieces of ginger into 1/2-1 cup of water and warm over stove top. Then mix the ingredients and repeat every 4 hours as needed.

## 2019-07-13 ENCOUNTER — Telehealth (INDEPENDENT_AMBULATORY_CARE_PROVIDER_SITE_OTHER): Payer: 59 | Admitting: Primary Care

## 2019-07-13 ENCOUNTER — Encounter: Payer: Self-pay | Admitting: Primary Care

## 2019-07-13 VITALS — Wt 251.0 lb

## 2019-07-13 DIAGNOSIS — F411 Generalized anxiety disorder: Secondary | ICD-10-CM | POA: Insufficient documentation

## 2019-07-13 DIAGNOSIS — F419 Anxiety disorder, unspecified: Secondary | ICD-10-CM

## 2019-07-13 MED ORDER — SERTRALINE HCL 25 MG PO TABS: 25.0000 mg | ORAL_TABLET | Freq: Every day | ORAL | 1 refills | Status: DC

## 2019-07-13 NOTE — Assessment & Plan Note (Signed)
Anxiety and stress due to family and work stress. GAD 7 score of 13 today. Discussed options for treatment, she opts for therapy and medication.  Referral placed for therapy.  Rx for Zoloft 25 mg sent to pharmacy  Patient is to take 1/2 tablet daily for 6 days, then advance to 1 full tablet thereafter. We discussed possible side effects of headache, GI upset, drowsiness, and SI/HI. If thoughts of SI/HI develop, we discussed to present to the emergency immediately. Patient verbalized understanding.   Follow up in 6 weeks for re-evaluation.

## 2019-07-13 NOTE — Patient Instructions (Signed)
Start sertraline (Zoloft) 25 mg tablets for anxiety. Start by taking 1/2 tablet by mouth daily for 6 days, then increase to 1 full tablet thereafter.  You will be contacted regarding your referral to therapy.  Please let us know if you have not been contacted within one week.   Schedule a follow up visit for 6 weeks.  It was a pleasure to see you today! [name], NP-C

## 2019-07-13 NOTE — Progress Notes (Signed)
Subjective:    Patient ID: Kathleen Ballard, female    DOB: 07/04/1961, 58 y.o.   MRN: [id number]  HPI  Virtual Visit via Video Note  I connected with [name] on 07/13/19 at  9:20 AM EDT by a video enabled telemedicine application and verified that I am speaking with the correct person using two identifiers.  Location: Patient: Work Provider: Office   I discussed the limitations of evaluation and management by telemedicine and the availability of in person appointments. The patient expressed understanding and agreed to proceed.  History of Present Illness:  Ms. Kathleen Ballard is a 58 year old female who presents today to discuss stress.  Symptoms include more "emotional", finding it hard to control emotions, increased GERD, muscle tension, increased irritability, worry.   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  Symptoms began in mid 2019/05/11 after the death of her father in law.   GAD 7 score for 13 score today. She has never been treated for these symptoms before. She denies depression symptoms.    Observations/Objective:  Alert and oriented. Appears well, not sickly. No distress. Speaking in complete sentences.   Assessment and Plan:  Anxiety and stress due to family and work stress. GAD 7 score of 13 today. Discussed options for treatment, she opts for therapy and medication.  Referral placed for therapy.  Rx for Zoloft 25 mg sent to pharmacy  Patient is to take 1/2 tablet daily for 6 days, then advance to 1 full tablet thereafter. We discussed possible side effects of headache, GI upset, drowsiness, and SI/HI. If thoughts of SI/HI develop, we discussed to present to the emergency immediately. Patient verbalized understanding.   Follow up in 6 weeks for re-evaluation.    Follow Up Instructions:  Start sertraline (Zoloft) 25 mg tablets  for anxiety. Start by taking 1/2 tablet by mouth daily for 6 days, then increase to 1 full tablet thereafter.  You will be contacted regarding your referral to therapy.  Please let us know if you have not been contacted within one week.   Schedule a follow up visit for 6 weeks.  It was a pleasure to see you today! [name], NP-C    I discussed the assessment and treatment plan with the patient. The patient was provided an opportunity to ask questions and all were answered. The patient agreed with the plan and demonstrated an understanding of the instructions.   The patient was advised to call back or seek an in-person evaluation if the symptoms worsen or if the condition fails to improve as anticipated.     Pleas [name], NP    Review of Systems  Respiratory: Negative for shortness of breath.   Cardiovascular: Negative for chest pain.  Neurological: Negative for dizziness.  Psychiatric/Behavioral: The patient is nervous/anxious.        See HPI       Past Medical History:  Diagnosis Date  . Allergic rhinitis 08/03/2016  . Allergy    Seasonal  . Blood transfusion without reported diagnosis   . Cancer ([place])    Stage III breast cancer - in remission  . Depression   . H/O bilateral mastectomy   . PUD (peptic ulcer disease)   . Thyroid disease    Graves' Disease     Social History   Socioeconomic History  . Marital  status: Married    Spouse name: Kathleen Ballard  . Number of children: 2  . Years of education: 9  . Highest education level: Not on file  Occupational History  . Occupation: Biomedical engineer  Social Needs  . Financial resource strain: Not on file  . Food insecurity    Worry: Not on file    Inability: Not on file  . Transportation needs    Medical: Not on file    Non-medical: Not on file  Tobacco Use  . Smoking status: Never Smoker  . Smokeless tobacco: Never Used  Substance and Sexual Activity  . Alcohol use: No    Alcohol/week: 0.0 standard  drinks  . Drug use: No  . Sexual activity: Not on file  Lifestyle  . Physical activity    Days per week: Not on file    Minutes per session: Not on file  . Stress: Not on file  Relationships  . Social Herbalist on phone: Not on file    Gets together: Not on file    Attends religious service: Not on file    Active member of club or organization: Not on file    Attends meetings of clubs or organizations: Not on file    Relationship status: Not on file  . Intimate partner violence    Fear of current or ex partner: Not on file    Emotionally abused: Not on file    Physically abused: Not on file    Forced sexual activity: Not on file  Other Topics Concern  . Not on file  Social History Narrative   Born and raised in [place], [place]. Currently resides in a private residence with her husband Kathleen Ballard) and daughter. Other daughter lives independently in [place], [place].    Pets - 4 dogs and 1 cat;    Fun: Watch movies and cross-stitch.    Denies religious beliefs that would effect healthcare.     Past Surgical History:  Procedure Laterality Date  . ABDOMINAL HYSTERECTOMY     Partial - both ovaries remain.  Marland Kitchen BREAST BIOPSY    . CHOLECYSTECTOMY    . ESOPHAGEAL DILATION      Family History  Problem Relation Age of Onset  . Heart disease Mother 44       CAD/PTCA  . Heart disease Father        atrrial fibrillation  . Heart disease Maternal Grandmother   . Cancer Maternal Grandmother        Breast cancer  . COPD Maternal Grandfather     Allergies  Allergen Reactions  . Erythromycin Nausea Only    Makes her have a sick feeling.  Marland Kitchen Penicillins     Was told to never take during allergy testing    Current Outpatient Medications on File Prior to Visit  Medication Sig Dispense Refill  . albuterol (PROVENTIL HFA;VENTOLIN HFA) 108 (90 Base) MCG/ACT inhaler Inhale 2 puffs into the lungs every 6 (six) hours as needed for wheezing or shortness of breath (cough, shortness of  breath or wheezing.). 1 Inhaler 1  . budesonide-formoterol (SYMBICORT) 80-4.5 MCG/ACT inhaler Inhale 2 puffs into the lungs 2 (two) times daily. 1 Inhaler 1  . fluticasone (FLONASE) 50 MCG/ACT nasal spray Place 2 sprays into both nostrils daily. 1 g 0   No current facility-administered medications on file prior to visit.     Wt 251 lb (113.9 kg)   BMI 40.51 kg/m    Objective:   Physical Exam  Constitutional: She is oriented to person, place, and time. She appears well-nourished.  Respiratory: Effort normal.  Neurological: She is alert and oriented to person, place, and time.  Psychiatric: She has a normal mood and affect.           Assessment & Plan:

## 2019-07-22 ENCOUNTER — Ambulatory Visit: Payer: Managed Care, Other (non HMO) | Admitting: Psychology

## 2019-09-12 ENCOUNTER — Other Ambulatory Visit: Payer: Self-pay | Admitting: Primary Care

## 2019-09-12 DIAGNOSIS — F419 Anxiety disorder, unspecified: Secondary | ICD-10-CM

## 2019-09-14 NOTE — Telephone Encounter (Signed)
See my chart message

## 2019-09-14 NOTE — Telephone Encounter (Signed)
Last prescribed on 07/13/2019. Last appointment on 07/13/2019. No future appointment

## 2019-09-16 NOTE — Assessment & Plan Note (Addendum)
Appears to be doing better on Zoloft 25 mg. She did not follow-up in the office or virtually as recommended, but did send a message via my chart. Continue same.

## 2019-09-26 NOTE — Progress Notes (Signed)
Patient Care Team: Pleas [name], NP as PCP - General (Internal Medicine)  DIAGNOSIS:    ICD-10-CM   1. 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, estrogen receptor positive ([place])  C50.811    C50.812    Z17.0     SUMMARY OF ONCOLOGIC HISTORY: 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[place])  09/2011 Initial Diagnosis   Palpable left breast mass status post bilateral mastectomies: Left: 5 cm IDC T3N3 (supraclavicular lymph node involvement) ER 0%, PR 20%, HER-2 positive ratio 2.9 there was extension into anterior and posterior margin, 3/5 lymph nodes positive; right: Incidentally discovered 0.8 cm grade 1 IDC with DCIS no prognostic panel done, 0/9 lymph nodes negative   11/27/2011 - 04/03/2012 Chemotherapy   Dose dense Adriamycin and Cytoxan followed by Taxol Herceptin, Herceptin maintenance completed 02/05/2013    03/2012 - 04/2012 Radiation Therapy   Adjuvant XRT   06/21/2012 - 06/21/2014 Anti-estrogen oral therapy   Could not tolerate multiple antiestrogen therapy options.     CHIEF COMPLIANT: Surveillance of bilateral breast cancers  INTERVAL HISTORY: Kathleen Ballard is a 58 y.o. with above-mentioned history of bilateral breast cancer treated with bilateral mastectomies, adjuvant chemotherapy, radiation, and who could not tolerate anti-estrogen therapy. I last saw her a year ago. She presents to the clinic today for annual follow-up.   REVIEW OF SYSTEMS:   Constitutional: Denies fevers, chills or abnormal weight loss Eyes: Denies blurriness of vision Ears, nose, mouth, throat, and face: Denies mucositis or sore throat Respiratory: Denies cough, dyspnea or wheezes Cardiovascular: Denies palpitation, chest discomfort Gastrointestinal: Denies nausea, heartburn or change in bowel habits Skin: Denies abnormal skin rashes Lymphatics: Denies new lymphadenopathy or easy bruising Neurological: Denies numbness, tingling or new  weaknesses Behavioral/Psych: Mood is stable, no new changes  Extremities: No lower extremity edema Breast: denies any pain or lumps or nodules in either breasts All other systems were reviewed with the patient and are negative.  I have reviewed the past medical history, past surgical history, social history and family history with the patient and they are unchanged from previous note.  ALLERGIES:  is allergic to erythromycin and penicillins.  MEDICATIONS:  Current Outpatient Medications  Medication Sig Dispense Refill  . albuterol (PROVENTIL HFA;VENTOLIN HFA) 108 (90 Base) MCG/ACT inhaler Inhale 2 puffs into the lungs every 6 (six) hours as needed for wheezing or shortness of breath (cough, shortness of breath or wheezing.). 1 Inhaler 1  . budesonide-formoterol (SYMBICORT) 80-4.5 MCG/ACT inhaler Inhale 2 puffs into the lungs 2 (two) times daily. 1 Inhaler 1  . fluticasone (FLONASE) 50 MCG/ACT nasal spray Place 2 sprays into both nostrils daily. 1 g 0  . sertraline (ZOLOFT) 25 MG tablet TAKE 1 TABLET (25 MG TOTAL) BY MOUTH DAILY. FOR ANXIETY. 90 tablet 0   No current facility-administered medications for this visit.     PHYSICAL EXAMINATION: ECOG PERFORMANCE STATUS: 1 - Symptomatic but completely ambulatory  Vitals:   09/27/19 0835  BP: (!) 139/91  Pulse: (!) 115  Resp: 18  Temp: 98.2 F (36.8 C)  SpO2: 98%   Filed Weights   09/27/19 0835  Weight: 259 lb 4.8 oz (117.6 kg)    GENERAL: alert, no distress and comfortable SKIN: skin color, texture, turgor are normal, no rashes or significant lesions EYES: normal, Conjunctiva are pink and non-injected, sclera clear OROPHARYNX: no exudate, no erythema and lips, buccal mucosa, and tongue normal  NECK: supple, thyroid normal size, non-tender, without nodularity  LYMPH: no palpable lymphadenopathy in the cervical, axillary or inguinal LUNGS: clear to auscultation and percussion with normal breathing effort HEART: regular rate &  rhythm and no murmurs and no lower extremity edema ABDOMEN: abdomen soft, non-tender and normal bowel sounds MUSCULOSKELETAL: no cyanosis of digits and no clubbing  NEURO: alert & oriented x 3 with fluent speech, no focal motor/sensory deficits EXTREMITIES: No lower extremity edema BREAST: No palpable lumps or nodules in the chest wall or axilla bilateral mastectomies scar tissue is much worse on the left. (exam performed in the presence of a chaperone)  LABORATORY DATA:  I have reviewed the data as listed CMP Latest Ref Rng & Units 01/14/2018 08/02/2016 11/03/2014  Glucose 70 - 99 mg/dL 109(H) 101(H) 106(H)  BUN 6 - 23 mg/dL 14 16 21   Creatinine 0.40 - 1.20 mg/dL 0.75 0.85 0.9  Sodium 135 - 145 mEq/L 141 142 141  Potassium 3.5 - 5.1 mEq/L 4.0 4.5 4.4  Chloride 96 - 112 mEq/L 104 104 106  CO2 19 - 32 mEq/L 29 31 27   Calcium 8.4 - 10.5 mg/dL 9.1 9.6 8.8  Total Protein 6.0 - 8.3 g/dL - 7.3 -  Total Bilirubin 0.2 - 1.2 mg/dL - 0.4 -  Alkaline Phos 39 - 117 U/L - 111 -  AST 0 - 37 U/L - 16 -  ALT 0 - 35 U/L - 16 -    Lab Results  Component Value Date   WBC 4.8 01/14/2018   HGB 13.3 01/14/2018   HCT 40.1 01/14/2018   MCV 86.3 01/14/2018   PLT 187.0 01/14/2018   NEUTROABS 4.1 08/02/2016    ASSESSMENT & PLAN:  Overlapping malignant neoplasm of female breast ([place]) Bilateral breast cancers (diagnosed and treated at [facility]) moved to [place] due to her job. Left: T3 N3 M0 stage III left IDC with questionable positive margins, 3/5 lymph nodes positive along with supraclavicular lymph node.  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  Breast cancer surveillance: 1.  Breast exam 09/27/2019 recall bilateral mastectomies no palpable lumps or nodules of concern 2.  No role for imaging since she had bilateral mastectomies.  Return to clinic for a telephone visit after the brain MRI to discuss the results.  No orders of the defined types were placed in this encounter.  The patient has a good understanding of the overall plan. she agrees with it. she will call with any problems that may develop before the next visit here.  Nicholas Lose, MD 09/27/2019  Julious Oka Dorshimer am acting as scribe for Dr. Nicholas Lose.  I have reviewed the above documentation for accuracy and completeness, and I agree with the above.

## 2019-09-27 ENCOUNTER — Ambulatory Visit (HOSPITAL_COMMUNITY)
Admission: RE | Admit: 2019-09-27 | Discharge: 2019-09-27 | Disposition: A | Discharge status: Home / Self Care | Payer: Managed Care, Other (non HMO) | Source: Ambulatory Visit | Attending: Hematology and Oncology | Admitting: Hematology and Oncology

## 2019-09-27 ENCOUNTER — Inpatient Hospital Stay: Payer: Managed Care, Other (non HMO) | Attending: Hematology and Oncology | Admitting: Hematology and Oncology

## 2019-09-27 ENCOUNTER — Other Ambulatory Visit: Payer: Self-pay

## 2019-09-27 ENCOUNTER — Telehealth: Payer: Self-pay

## 2019-09-27 ENCOUNTER — Telehealth: Payer: Self-pay | Admitting: Hematology and Oncology

## 2019-09-27 VITALS — BP 139/91 | HR 115 | Temp 98.2°F | Resp 18 | Ht 69.5 in | Wt 259.3 lb

## 2019-09-27 DIAGNOSIS — R519 Headache, unspecified: Secondary | ICD-10-CM

## 2019-09-27 DIAGNOSIS — Z170 Estrogen receptor positive status [ER+]: Secondary | ICD-10-CM

## 2019-09-27 DIAGNOSIS — C50811 Malignant neoplasm of overlapping sites of right female breast: Secondary | ICD-10-CM

## 2019-09-27 DIAGNOSIS — K2100 Gastro-esophageal reflux disease with esophagitis, without bleeding: Secondary | ICD-10-CM

## 2019-09-27 DIAGNOSIS — R51 Headache: Secondary | ICD-10-CM | POA: Insufficient documentation

## 2019-09-27 DIAGNOSIS — C50812 Malignant neoplasm of overlapping sites of left female breast: Secondary | ICD-10-CM | POA: Diagnosis not present

## 2019-09-27 LAB — I-STAT BETA HCG BLOOD, ED (NOT ORDERABLE): I-stat hCG, quantitative: 11 m[IU]/mL — ABNORMAL HIGH (ref <5)

## 2019-09-27 LAB — POCT I-STAT CREATININE: Creatinine, Ser: 0.7 mg/dL (ref 0.44–1.00)

## 2019-09-27 MED ORDER — GADOBUTROL 1 MMOL/ML IV SOLN
10.0000 mL | Freq: Once | INTRAVENOUS | Status: AC | PRN
  Administered 2019-09-27: 10 mL via INTRAVENOUS

## 2019-09-27 NOTE — Telephone Encounter (Signed)
RN received authorization from staff for STAT MRI Brain W and WOut contrast.   Pt scheduled for 09/27/2019 @ 730pm.  Pt notified.  Telephone visit schedule for 09/28/2019 to review results per MD recommendations.

## 2019-09-27 NOTE — Progress Notes (Signed)
Attempted to get pt down for MRI at 1800 called ER multiple times with no answer will try and get pt later today.

## 2019-09-27 NOTE — Assessment & Plan Note (Signed)
Bilateral breast cancers (diagnosed and treated at [facility]) moved to [place] due to her job. Left: T3 N3 M0 stage III left IDC with questionable positive margins, 3/5 lymph nodes positive along with supraclavicular lymph node.  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  Breast exam 09/27/2019 recall bilateral mastectomies no palpable lumps or nodules of concern 2.  No role for imaging since she had bilateral mastectomies.  Return to clinic in 1 year for surveillance and follow-up.

## 2019-09-27 NOTE — Telephone Encounter (Signed)
Contacted patient to verify telephone visit for pre reg °

## 2019-09-27 NOTE — Progress Notes (Signed)
Previous note entered in error could not delete

## 2019-09-27 NOTE — Progress Notes (Signed)
  HEMATOLOGY-ONCOLOGY TELEPHONE VISIT PROGRESS NOTE  I connected with Kathleen Ballard on 09/28/2019 at  9:00 AM EDT by telephone and verified that I am speaking with the correct person using two identifiers.  I discussed the limitations, risks, security and privacy concerns of performing an evaluation and management service by telephone and the availability of in person appointments.  I also discussed with the patient that there may be a patient responsible charge related to this service. The patient expressed understanding and agreed to proceed.   History of Present Illness: Kathleen Ballard is a 58 y.o. female with above-mentioned history of bilateral breast cancer treated with bilateral mastectomies, adjuvant chemotherapy, radiation, and who could not tolerate anti-estrogen therapy. Brain MRI on 09/27/19 showed no evidence of acute intracranial abnormality or metastatic disease and was normal for age. She presents over the phone today to review her MRI.   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[place])  09/2011 Initial Diagnosis   Palpable left breast mass status post bilateral mastectomies: Left: 5 cm IDC T3N3 (supraclavicular lymph node involvement) ER 0%, PR 20%, HER-2 positive ratio 2.9 there was extension into anterior and posterior margin, 3/5 lymph nodes positive; right: Incidentally discovered 0.8 cm grade 1 IDC with DCIS no prognostic panel done, 0/9 lymph nodes negative   11/27/2011 - 04/03/2012 Chemotherapy   Dose dense Adriamycin and Cytoxan followed by Taxol Herceptin, Herceptin maintenance completed 02/05/2013   03/2012 - 04/2012 Radiation Therapy   Adjuvant XRT   06/21/2012 - 06/21/2014 Anti-estrogen oral therapy   Could not tolerate multiple antiestrogen therapy options.     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 Left: T3 N3 M0 stage III left IDC with questionable positive margins, 3/5 lymph nodes positive along with supraclavicular lymph node. 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 Instructed her to discuss with her primary care physician about management of her headaches.  Breast cancer surveillance: 1.  Breast exam 09/27/2019 recall bilateral mastectomies no palpable lumps or nodules of concern 2.  No role for imaging since she had bilateral mastectomies.  Follow-up in 1 year    I discussed the assessment and treatment plan with the patient. The patient was provided an opportunity to ask questions and all were answered. The patient agreed with the plan and demonstrated an understanding of the instructions. The patient was advised to call back or seek an in-person evaluation if the symptoms worsen or if the condition fails to improve as anticipated.   I provided 11 minutes of non-face-to-face time during this encounter.   Rulon Eisenmenger, MD 09/28/2019    I, Molly Dorshimer, am acting as scribe for Nicholas Lose, MD.  I have reviewed the above documentation for accuracy and completeness, and I agree with the above.

## 2019-09-28 ENCOUNTER — Inpatient Hospital Stay (HOSPITAL_BASED_OUTPATIENT_CLINIC_OR_DEPARTMENT_OTHER): Payer: Managed Care, Other (non HMO) | Admitting: Hematology and Oncology

## 2019-09-28 DIAGNOSIS — C50812 Malignant neoplasm of overlapping sites of left female breast: Secondary | ICD-10-CM | POA: Diagnosis not present

## 2019-09-28 DIAGNOSIS — C50811 Malignant neoplasm of overlapping sites of right female breast: Secondary | ICD-10-CM

## 2019-09-28 DIAGNOSIS — Z170 Estrogen receptor positive status [ER+]: Secondary | ICD-10-CM | POA: Diagnosis not present

## 2019-09-28 NOTE — Assessment & Plan Note (Signed)
Bilateral breast cancers (diagnosed and treated at [facility]) moved to [place] due to her job. Left: T3 N3 M0 stage III left IDC with questionable positive margins, 3/5 lymph nodes positive along with supraclavicular lymph node. 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 Instructed her to discuss with her primary care physician about management of her headaches.  Breast cancer surveillance: 1.  Breast exam 09/27/2019 recall bilateral mastectomies no palpable lumps or nodules of concern 2.  No role for imaging since she had bilateral mastectomies.  Follow-up in 1 year

## 2019-09-30 ENCOUNTER — Telehealth (INDEPENDENT_AMBULATORY_CARE_PROVIDER_SITE_OTHER): Payer: Managed Care, Other (non HMO) | Admitting: Primary Care

## 2019-09-30 ENCOUNTER — Encounter: Payer: Self-pay | Admitting: Primary Care

## 2019-09-30 ENCOUNTER — Other Ambulatory Visit: Payer: Self-pay

## 2019-09-30 DIAGNOSIS — G43709 Chronic migraine without aura, not intractable, without status migrainosus: Secondary | ICD-10-CM | POA: Diagnosis not present

## 2019-09-30 DIAGNOSIS — R519 Headache, unspecified: Secondary | ICD-10-CM | POA: Diagnosis not present

## 2019-09-30 DIAGNOSIS — G43909 Migraine, unspecified, not intractable, without status migrainosus: Secondary | ICD-10-CM | POA: Insufficient documentation

## 2019-09-30 MED ORDER — PROPRANOLOL HCL ER 80 MG PO CP24: 80.0000 mg | ORAL_CAPSULE | Freq: Every day | ORAL | 1 refills | Status: DC

## 2019-09-30 MED ORDER — SUMATRIPTAN SUCCINATE 25 MG PO TABS: ORAL_TABLET | ORAL | 0 refills | Status: DC

## 2019-09-30 NOTE — Patient Instructions (Signed)
Start Propranolol ER 80 mg daily for headache prevention.  Take the sumatriptan (Imitrex) at first sign of migraine. May repeat 2 hours later if migraine persists. Do not take more than 2 tablets in 24 hours. It may cause drowsiness.  Please schedule a follow up visit with me for 6-8 weeks for follow up of migraines/headaches.  Message me sooner if you have any problems.  It was a pleasure to see you today! [name], NP-C

## 2019-09-30 NOTE — Assessment & Plan Note (Signed)
Chronic daily headaches, now with acute migraine that is persistent. Discussed options for both abortive and maintenance treatment. She would like to try Imitrex again, also agrees to daily treatment. Rx for Sumatriptan 25 mg sent to pharmacy, instructions provided. Rx for Propranolol ER 80 mg send to pharmacy. She will update in several weeks. Return precautions provided.

## 2019-09-30 NOTE — Progress Notes (Signed)
Subjective:    Patient ID: Kathleen Ballard, female    DOB: May 24, 1961, 58 y.o.   MRN: [id number]  HPI  Virtual Visit via Video Note  I connected with DERIYAH KUNATH on 09/30/19 at  8:40 AM EDT by a video enabled telemedicine application and verified that I am speaking with the correct person using two identifiers.  Location: Patient: Home Provider: Office   I discussed the limitations of evaluation and management by telemedicine and the availability of in person appointments. The patient expressed understanding and agreed to proceed.  History of Present Illness:  Kathleen Ballard is a 58 year old female with a history of breast cancer, migraines, allergic rhinitis, asthma, anxiety who presents today with a chief complaint of headaches.  Today she endorses a prior history of migraines years ago. She was treated at the headache clinic years ago, was managed on both oral medications and injections. She was once managed on nightly medication for headache prevention which helped. Was also on Imitrex which made her feel "swimmy headed".   She developed a migraine to the right frontal and parietal lobe that began about three weeks ago and has persisted since. She describes her pain as pounding. She also reports nausea with her migraine. Denies photophobia and phonophobia. She's been taking Excedrin Migraine with temporary improvement.   She will experience daily headaches which has been present for years. She is takes Tylenol arthritis every morning (1000 mg), will sometimes take Aleve or Ibuprofen. She's been taking this regimen for years.    Observations/Objective:  Alert and oriented. Appears well, not sickly. No distress. Speaking in complete sentences.   Assessment and Plan:  Chronic daily headaches, now with acute migraine that is persistent. Discussed options for both abortive and maintenance treatment. She would like to try Imitrex again, also agrees to daily treatment. Rx  for Sumatriptan 25 mg sent to pharmacy, instructions provided. Rx for Propranolol ER 80 mg send to pharmacy. She will update in several weeks. Return precautions provided.   Follow Up Instructions:  Start Propranolol ER 80 mg daily for headache prevention.  Take the sumatriptan (Imitrex) at first sign of migraine. May repeat 2 hours later if migraine persists. Do not take more than 2 tablets in 24 hours. It may cause drowsiness.  Please schedule a follow up visit with me for 6-8 weeks for follow up of migraines/headaches.  Message me sooner if you have any problems.  It was a pleasure to see you today! [name], NP-C    I discussed the assessment and treatment plan with the patient. The patient was provided an opportunity to ask questions and all were answered. The patient agreed with the plan and demonstrated an understanding of the instructions.   The patient was advised to call back or seek an in-person evaluation if the symptoms worsen or if the condition fails to improve as anticipated.    Pleas [name], NP    Review of Systems  Eyes: Negative for photophobia and visual disturbance.  Gastrointestinal: Positive for nausea. Negative for vomiting.  Neurological: Positive for headaches. Negative for dizziness.       Past Medical History:  Diagnosis Date  . Allergic rhinitis 08/03/2016  . Allergy    Seasonal  . Blood transfusion without reported diagnosis   . Cancer ([place])    Stage III breast cancer - in remission  . Depression   . H/O bilateral mastectomy   . PUD (peptic ulcer disease)   . Thyroid  disease    Graves' Disease     Social History   Socioeconomic History  . Marital status: Married    Spouse name: Kathleen Ballard  . Number of children: 2  . Years of education: 1  . Highest education level: Not on file  Occupational History  . Occupation: Biomedical engineer  Social Needs  . Financial resource strain: Not on file  . Food insecurity    Worry: Not on  file    Inability: Not on file  . Transportation needs    Medical: Not on file    Non-medical: Not on file  Tobacco Use  . Smoking status: Never Smoker  . Smokeless tobacco: Never Used  Substance and Sexual Activity  . Alcohol use: No    Alcohol/week: 0.0 standard drinks  . Drug use: No  . Sexual activity: Not on file  Lifestyle  . Physical activity    Days per week: Not on file    Minutes per session: Not on file  . Stress: Not on file  Relationships  . Social Herbalist on phone: Not on file    Gets together: Not on file    Attends religious service: Not on file    Active member of club or organization: Not on file    Attends meetings of clubs or organizations: Not on file    Relationship status: Not on file  . Intimate partner violence    Fear of current or ex partner: Not on file    Emotionally abused: Not on file    Physically abused: Not on file    Forced sexual activity: Not on file  Other Topics Concern  . Not on file  Social History Narrative   Born and raised in [place], [place]. Currently resides in a private residence with her husband Kathleen Ballard) and daughter. Other daughter lives independently in [place], [place].    Pets - 4 dogs and 1 cat;    Fun: Watch movies and cross-stitch.    Denies religious beliefs that would effect healthcare.     Past Surgical History:  Procedure Laterality Date  . ABDOMINAL HYSTERECTOMY     Partial - both ovaries remain.  Marland Kitchen BREAST BIOPSY    . CHOLECYSTECTOMY    . ESOPHAGEAL DILATION      Family History  Problem Relation Age of Onset  . Heart disease Mother 59       CAD/PTCA  . Heart disease Father        atrrial fibrillation  . Heart disease Maternal Grandmother   . Cancer Maternal Grandmother        Breast cancer  . COPD Maternal Grandfather     Allergies  Allergen Reactions  . Erythromycin Nausea Only    Makes her have a sick feeling.  Marland Kitchen Penicillins     Was told to never take during allergy testing     Current Outpatient Medications on File Prior to Visit  Medication Sig Dispense Refill  . albuterol (PROVENTIL HFA;VENTOLIN HFA) 108 (90 Base) MCG/ACT inhaler Inhale 2 puffs into the lungs every 6 (six) hours as needed for wheezing or shortness of breath (cough, shortness of breath or wheezing.). 1 Inhaler 1  . budesonide-formoterol (SYMBICORT) 80-4.5 MCG/ACT inhaler Inhale 2 puffs into the lungs 2 (two) times daily. 1 Inhaler 1  . fluticasone (FLONASE) 50 MCG/ACT nasal spray Place 2 sprays into both nostrils daily. 1 g 0  . sertraline (ZOLOFT) 25 MG tablet TAKE 1 TABLET (25 MG TOTAL) BY  MOUTH DAILY. FOR ANXIETY. 90 tablet 0   No current facility-administered medications on file prior to visit.     Temp (!) 97.3 F (36.3 C) (Temporal)   Wt 259 lb (117.5 kg)   BMI 37.70 kg/m    Objective:   Physical Exam  Constitutional: She is oriented to person, place, and time. She appears well-nourished.  Respiratory: Effort normal.  Neurological: She is alert and oriented to person, place, and time.  Psychiatric: She has a normal mood and affect.           Assessment & Plan:

## 2019-10-24 ENCOUNTER — Other Ambulatory Visit: Payer: Self-pay | Admitting: Primary Care

## 2019-10-24 DIAGNOSIS — F419 Anxiety disorder, unspecified: Secondary | ICD-10-CM

## 2019-10-27 ENCOUNTER — Other Ambulatory Visit: Payer: Self-pay | Admitting: Primary Care

## 2019-10-27 DIAGNOSIS — G43709 Chronic migraine without aura, not intractable, without status migrainosus: Secondary | ICD-10-CM

## 2019-11-28 ENCOUNTER — Other Ambulatory Visit: Payer: Self-pay | Admitting: Primary Care

## 2019-11-28 DIAGNOSIS — R519 Headache, unspecified: Secondary | ICD-10-CM

## 2019-11-30 NOTE — Telephone Encounter (Signed)
See my chart message

## 2019-11-30 NOTE — Telephone Encounter (Signed)
Last prescribed on 09/30/2019. Last appointment on 09/30/2019. No future appointment

## 2019-12-14 ENCOUNTER — Other Ambulatory Visit: Payer: Self-pay | Admitting: Primary Care

## 2019-12-14 DIAGNOSIS — G43709 Chronic migraine without aura, not intractable, without status migrainosus: Secondary | ICD-10-CM

## 2020-01-04 ENCOUNTER — Ambulatory Visit: Payer: Managed Care, Other (non HMO) | Attending: Internal Medicine

## 2020-01-04 ENCOUNTER — Telehealth (INDEPENDENT_AMBULATORY_CARE_PROVIDER_SITE_OTHER): Payer: Managed Care, Other (non HMO) | Admitting: Family Medicine

## 2020-01-04 ENCOUNTER — Encounter: Payer: Self-pay | Admitting: Family Medicine

## 2020-01-04 ENCOUNTER — Ambulatory Visit
Admission: EM | Admit: 2020-01-04 | Discharge: 2020-01-04 | Disposition: A | Discharge status: Home / Self Care | Payer: Managed Care, Other (non HMO) | Attending: Physician Assistant | Admitting: Physician Assistant

## 2020-01-04 DIAGNOSIS — L03113 Cellulitis of right upper limb: Secondary | ICD-10-CM

## 2020-01-04 DIAGNOSIS — Z20822 Contact with and (suspected) exposure to covid-19: Secondary | ICD-10-CM

## 2020-01-04 MED ORDER — CEPHALEXIN 500 MG PO CAPS: 500.0000 mg | ORAL_CAPSULE | Freq: Four times a day (QID) | ORAL | 0 refills | Status: DC

## 2020-01-04 NOTE — ED Triage Notes (Signed)
Pt c/o cough, sore throat, chills and fever yesterday. States has a pending COVID test. States this evening developed a red rash with heat to rt lateral/posterior forearm.

## 2020-01-04 NOTE — Discharge Instructions (Signed)
Start keflex as directed. Warm compress, elevation. Monitor for spreading redness, warmth, increased swelling/increased pain, follow up for reevaluation needed. Otherwise, remain in quarantine until testing results return.

## 2020-01-04 NOTE — ED Provider Notes (Signed)
[facility]    CSN: [id number] Arrival date & time: 01/04/20  1748      History   Chief Complaint Chief Complaint  Patient presents with  . Fever    HPI Kathleen Ballard is a 59 y.o. female.   59 year old female with history of breast cancer, s/p bilateral mastectomies, radiation/chemotherapy, currently in remission comes in for possible cellulitis of the right arm.  Patient started having cough, sore throat, chills, subjective fevers yesterday.  T-max 100.  Already has Covid test pending.  States today, noticed rash with swelling, redness, warmth, pain to the right forearm.  Discussed this with her doctors, who suggested checking for cellulitis.  She denies chest pain, shortness of breath.  Denies spreading erythema.  Denies injury/trauma.  Denies swelling, numbness, tingling, itching.  Has history of cellulitis to this arm.      Past Medical History:  Diagnosis Date  . Allergic rhinitis 08/03/2016  . Allergy    Seasonal  . Blood transfusion without reported diagnosis   . Cancer ([place])    Stage III breast cancer - in remission  . Depression   . H/O bilateral mastectomy   . PUD (peptic ulcer disease)   . Thyroid disease    Graves' Disease    Patient Active Problem List   Diagnosis Date Noted  . Frequent headaches 09/30/2019  . Migraines 09/30/2019  . Anxiety 07/13/2019  . Allergic rhinitis 08/03/2016  . Peripheral edema 08/03/2016  . Asthma with exacerbation 08/02/2016  . 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 Preventative health care 11/03/2014    Past Surgical History:  Procedure Laterality Date  . ABDOMINAL HYSTERECTOMY     Partial - both ovaries remain.  Marland Kitchen BREAST BIOPSY    . CHOLECYSTECTOMY    . ESOPHAGEAL DILATION      OB History   No obstetric history on file.      Home Medications    Prior to Admission medications   Medication Sig Start Date End Date Taking? Authorizing Provider  albuterol (PROVENTIL  HFA;VENTOLIN HFA) 108 (90 Base) MCG/ACT inhaler Inhale 2 puffs into the lungs every 6 (six) hours as needed for wheezing or shortness of breath (cough, shortness of breath or wheezing.). 03/12/19   Tasia Catchings, Jiaire Rosebrook V, PA-C  budesonide-formoterol (SYMBICORT) 80-4.5 MCG/ACT inhaler Inhale 2 puffs into the lungs 2 (two) times daily. 03/12/19   Tasia Catchings, Milicent Acheampong V, PA-C  cephALEXin (KEFLEX) 500 MG capsule Take 1 capsule (500 mg total) by mouth 4 (four) times daily. 01/04/20   Tasia Catchings, Gumaro Brightbill V, PA-C  propranolol ER (INDERAL LA) 80 MG 24 hr capsule TAKE 1 CAPSULE BY MOUTH DAILY FOR HEADACHE PREVENTION 12/01/19   Pleas [name], NP  sertraline (ZOLOFT) 25 MG tablet TAKE 1 TABLET (25 MG TOTAL) BY MOUTH DAILY. FOR ANXIETY. 10/26/19   Pleas [name], NP  SUMAtriptan (IMITREX) 25 MG tablet 1- 2 TABS AT MIGRAINE ONSET. REPEAT WITH 1 IN 2HRS IF HEADACHE PERSISTS OR RECURS NO MORE >4/24HRS 10/28/19   Pleas [name], NP    Family History Family History  Problem Relation Age of Onset  . Heart disease Mother 62       CAD/PTCA  . Heart disease Father        atrrial fibrillation  . Heart disease Maternal Grandmother   . Cancer Maternal Grandmother        Breast cancer  . COPD Maternal Grandfather     Social History Social History  Tobacco Use  . Smoking status: Never Smoker  . Smokeless tobacco: Never Used  Substance Use Topics  . Alcohol use: No    Alcohol/week: 0.0 standard drinks  . Drug use: No     Allergies   Erythromycin and Penicillins   Review of Systems Review of Systems  Reason unable to perform ROS: See HPI as above.     Physical Exam Triage Vital Signs ED Triage Vitals [01/04/20 1757]  Enc Vitals Group     BP 116/72     Pulse Rate 76     Resp 16     Temp 98.7 F (37.1 C)     Temp Source Oral     SpO2 95 %     Weight      Height      Head Circumference      Peak Flow      Pain Score 5     Pain Loc      Pain Edu?      Excl. in [place]?    No data found.  Updated Vital Signs BP  116/72 (BP Location: Left Arm)   Pulse 76   Temp 98.7 F (37.1 C) (Oral)   Resp 16   SpO2 95%   Physical Exam Constitutional:      General: She is not in acute distress.    Appearance: Normal appearance. She is not ill-appearing, toxic-appearing or diaphoretic.  HENT:     Head: Normocephalic and atraumatic.  Cardiovascular:     Rate and Rhythm: Normal rate and regular rhythm.     Heart sounds: Normal heart sounds. No murmur. No friction rub. No gallop.   Pulmonary:     Effort: Pulmonary effort is normal. No accessory muscle usage, prolonged expiration, respiratory distress or retractions.     Comments: Lungs clear to auscultation without adventitious lung sounds. Musculoskeletal:     Cervical back: Normal range of motion and neck supple.  Skin:    General: Skin is warm and dry.     Comments: See picture below, large erythema to the right forearm with warmth and tenderness.  No fluctuance, induration.  No streaking.  No swelling of the hand.  Full range of motion of elbow, wrist.  Neurological:     General: No focal deficit present.     Mental Status: She is alert and oriented to person, place, and time.            UC Treatments / Results  Labs (all labs ordered are listed, but only abnormal results are displayed) Labs Reviewed - No data to display  EKG   Radiology No results found.  Procedures Procedures (including critical care time)  Medications Ordered in UC Medications - No data to display  Initial Impression / Assessment and Plan / UC Course  I have reviewed the triage vital signs and the nursing notes.  Pertinent labs & imaging results that were available during my care of the patient were reviewed by me and considered in my medical decision making (see chart for details).    Will treat for cellulitis at this time.  Patient with history of penicillin allergy from allergy testing.  Has tolerated Keflex in the past.  Start Keflex as directed.  Warm  compress, elevation.  Patient to continue to quarantine until Covid testing results return.  Return precautions given.  Patient expresses understanding and agrees to plan.  Final Clinical Impressions(s) / UC Diagnoses   Final diagnoses:  Cellulitis of right upper extremity  ED Prescriptions    Medication Sig Dispense Auth. Provider   cephALEXin (KEFLEX) 500 MG capsule Take 1 capsule (500 mg total) by mouth 4 (four) times daily. 28 capsule Ok Edwards, PA-C     PDMP not reviewed this encounter.   Ok Edwards, PA-C 01/04/20 1830

## 2020-01-04 NOTE — Progress Notes (Signed)
VIRTUAL VISIT Due to national recommendations of social distancing due to [place] 19, a virtual visit is felt to be most appropriate for this patient at this time.   I connected with the patient on 01/04/20 at 10:40 AM EST by virtual telehealth platform and verified that I am speaking with the correct person using two identifiers.   I discussed the limitations, risks, security and privacy concerns of performing an evaluation and management service by  virtual telehealth platform and the availability of in person appointments. I also discussed with the patient that there may be a patient responsible charge related to this service. The patient expressed understanding and agreed to proceed.  Patient location: Home Provider Location:  [facility] Participants: Eliezer Lofts and Alyce Pagan   Chief Complaint  Patient presents with  . Scratchy Throat  . Chills    Is scheduled for Covid testing this afternoon  . Head Injury  . Generalized Body Aches  . Fever    History of Present Illness:  Fever  This is a new problem. The current episode started yesterday. Episode frequency:  100 F. The problem has been unchanged. The temperature was taken using an oral thermometer. Associated symptoms include congestion, diarrhea, muscle aches and a sore throat. Pertinent negatives include no abdominal pain, chest pain, coughing, ear pain, headaches, nausea, urinary pain, vomiting or wheezing. Associated symptoms comments: Chills, body ache   facial pain. She has tried acetaminophen for the symptoms. The treatment provided mild relief.   Keeping up with fluids.   She has COVID test scheduled for 2: 30 over at [facility].   She is not currently needing albuterol. She only uses Symbicort when allergies flare   COVID 19 screen No recent travel. 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 She was last around them last week and yesterday.  The importance of social  distancing was discussed today.   Review of Systems  Constitutional: Positive for fever.  HENT: Positive for congestion and sore throat. Negative for ear pain.   Respiratory: Negative for cough and wheezing.   Cardiovascular: Negative for chest pain.  Gastrointestinal: Positive for diarrhea. Negative for abdominal pain, nausea and vomiting.  Genitourinary: Negative for dysuria.  Neurological: Negative for headaches.      Past Medical History:  Diagnosis Date  . Allergic rhinitis 08/03/2016  . Allergy    Seasonal  . Blood transfusion without reported diagnosis   . Cancer ([place])    Stage III breast cancer - in remission  . Depression   . H/O bilateral mastectomy   . PUD (peptic ulcer disease)   . Thyroid disease    Graves' Disease    reports that she has never smoked. She has never used smokeless tobacco. She reports that she does not drink alcohol or use drugs.   Current Outpatient Medications:  .  albuterol (PROVENTIL HFA;VENTOLIN HFA) 108 (90 Base) MCG/ACT inhaler, Inhale 2 puffs into the lungs every 6 (six) hours as needed for wheezing or shortness of breath (cough, shortness of breath or wheezing.)., Disp: 1 Inhaler, Rfl: 1 .  budesonide-formoterol (SYMBICORT) 80-4.5 MCG/ACT inhaler, Inhale 2 puffs into the lungs 2 (two) times daily., Disp: 1 Inhaler, Rfl: 1 .  propranolol ER (INDERAL LA) 80 MG 24 hr capsule, TAKE 1 CAPSULE BY MOUTH DAILY FOR HEADACHE PREVENTION, Disp: 90 capsule, Rfl: 1 .  sertraline (ZOLOFT) 25 MG tablet, TAKE 1 TABLET (25 MG TOTAL) BY MOUTH DAILY. FOR ANXIETY., Disp: 90 tablet, Rfl:  1 .  SUMAtriptan (IMITREX) 25 MG tablet, 1- 2 TABS AT MIGRAINE ONSET. REPEAT WITH 1 IN 2HRS IF HEADACHE PERSISTS OR RECURS NO MORE >4/24HRS, Disp: 10 tablet, Rfl: 0   Observations/Objective: There were no vitals taken for this visit.  Physical Exam  Physical Exam Constitutional:      General: The patient is not in acute distress. Pulmonary:     Effort: Pulmonary effort is  normal. No respiratory distress.  Neurological:     Mental Status: The patient is alert and oriented to person, place, and time.  Psychiatric:        Mood and Affect: Mood normal.        Behavior: Behavior normal.   Assessment and Plan    I discussed the assessment and treatment plan with the patient. The patient was provided an opportunity to ask questions and all were answered. The patient agreed with the plan and demonstrated an understanding of the instructions.   The patient was advised to call back or seek an in-person evaluation if the symptoms worsen or if the condition fails to improve as anticipated.   Suspected COVID-19 virus infection No current associated asthma flare but recommended restarting singulair.   Likely COVID19  infection vs other viral infection. No clear sign of bacterial infection at this time.  Recommend testing ... pt scheduled. No SOB.  No red flags/need for ER visit or in-person exam at respiratory clinic at this time.Marland Kitchen    Pt mild/moderate risk for COVID complications given asthma If SOB begins symptoms worsening.. have low threshold for in-person exam, if severe shortness of breath ER visit recommended.  Can monitor Oxygen saturation at home with home monitor if able to obtain.  Go to ER if O2 sat < 90% on room air.  Reviewed home care and provided information through [place].  Recommended isolation until test returns. If returns positive 10 days quarantine recommended.  Provided info about prevention of spread of COVID 19.     Eliezer Lofts, MD

## 2020-01-04 NOTE — Patient Instructions (Signed)
Get tested as planned.  Restart Symbicort.  Go to ER if shortness of breath.  How to care for yourself at home:   1) Drink plenty of fluids 2) Get lots of rest 3) Wash your hands regularly - for 20 seconds 4) Cover your mouth when you cough -- ideally cough into your elbow 5) Take tylenol - can do up to 1000 mg every 8 hours (or do smaller doses more frequently) - Avoid Ibuprofen if able 6) Stay home - avoid contact with other people. Ideally have someone bring your groceries, etc.  7) Avoid touching your eyes, nose, mouth 8) Over the counter cold medication may be helpful  Positive Coronovirus - When isolation ends 1) 10 days have passed from when symptoms started  2) Other symptoms have improved - no longer with cough or shortness of breath 3) At least 24 hours since your last fever without needing any tylenol or ibuprofen  Call the clinic immediately or consider going to the emergency room if:  1) Trouble Breathing 2) Persistent pain or pressure in the chest 3) New confusion or inability to wake 4) Bluish lips or face 5) Notify them that you may have COVID-19  For close contacts (people in your home)  1) Help the person you are with stay home - grocery, pharmacy, etc 2) Help monitor their symptoms for worsening illness 3) Ideally sleep in a separate room and try to use separate bathroom if possible 4) Try to limit care giver to ONE person - all others (including pets) should avoid contact 5) Clean surfaces often and wash laundry often 6) Monitor yourself for symptoms. Make sure you contact your health care provider and avoid work as soon as you develop symptoms 7) Ideally would recommend working from home or not going to work if able.  8) Get tested and monitor for symptoms over the next 14 days.   Medications:  You may have heard about medications currently being used to treat COVID-19 - including  Dexamethasone, Remdesivir and Hydroxychloroquine/Chloroquine. These are only  being used in hospitalized patients because they come with significant risks.   The only proven treatment is symptomatic care - rest, fluids, tylenol.

## 2020-01-05 ENCOUNTER — Telehealth: Payer: Self-pay

## 2020-01-05 NOTE — Telephone Encounter (Signed)
Per chart review pt went to [facility] on 01/03/19.

## 2020-01-05 NOTE — Telephone Encounter (Signed)
[place] Night - Client TELEPHONE ADVICE RECORD [facility] Patient Name: Kathleen Ballard Gender: Female DOB: January 22, 1961 Age: 59 Y 9 M 23 D Return Phone Number: [phone] (Primary) Address: City/State/Zip: [place] [address] Client [place] Night - Client Client Site [facility] Physician Alma Friendly - NP Contact Type Call Who Is Calling Patient / Member / Family / Caregiver Call Type Triage / Clinical Caller Name Joe Tanney Relationship To Patient Self Return Phone Number [phone] (Primary) Chief Complaint Arm Pain (no known cause) Reason for Call Symptomatic / Request for [name] states her arm is red, swollen, warm to touch Translation No Nurse Assessment Nurse: Shona Simpson, RN, Shirlee Limerick Date/Time [name] Time): 01/04/2020 5:18:32 PM Confirm and document reason for call. If symptomatic, describe symptoms. ---Caller states her right lower arm is red, swollen, warm to touch, noticed it tonight. Pt states redness seems to be spreading. No pain to arm. Pt had cellulitis about 4-5 yrs ago in left arm. Pt reports lymphedema in both arms. No drainage from arm. Fever last night but reports also being tested for covid. Reports tingling in right fingers last night but no numbness or tingling right now. Reports chills, sore throat and aches from possible covid. Pt had virtual visit regarding these symptoms. Pt reports she has developed cough since this visit and is taking symbicort for relief. Has the patient had close contact with a person known or suspected to have the novel coronavirus illness OR traveled / lives in area with major community spread (including international travel) in the last 14 days from the onset of symptoms? * If Asymptomatic, screen for exposure and travel within the last 14 days. ---Yes Does the patient have any new or worsening  symptoms? ---Yes Will a triage be completed? ---Yes Related visit to physician within the last 2 weeks? ---No Does the PT have any chronic conditions? (i.e. diabetes, asthma, this includes High risk factors for pregnancy, etc.) ---Yes List chronic conditions. ---Breast cancer (in remission for 8 yrs) Is this a behavioral health or substance abuse call? ---No PLEASE NOTE: All timestamps contained within this report are represented as [place] Standard Time. CONFIDENTIALTY NOTICE: This fax transmission is intended only for the addressee. It contains information that is legally privileged, confidential or otherwise protected from use or disclosure. If you are not the intended recipient, you are strictly prohibited from reviewing, disclosing, copying using or disseminating any of this information or taking any action in reliance on or regarding this information. If you have received this fax in error, please notify us immediately by telephone so that we can arrange for its return to [name]. Phone: [phone], Toll-Free: [phone], Fax: [phone] Page: 2 of 2 Call Id: [id number] Guidelines Guideline Title Affirmed Question Affirmed Notes Nurse Date/Time [name] Time) Arm Swelling and Edema [1] Red area or streak AND [2] fever Burman Foster 01/04/2020 5:24:08 PM Disp. Time [name] Time) Disposition Final User 01/04/2020 5:28:58 PM Go to ED Now (or PCP triage) Yes Shona Simpson, RN, Abran Duke Disagree/Comply Comply Caller Understands Yes PreDisposition Call Doctor Care Advice Given Per Guideline GO TO ED NOW (OR PCP TRIAGE): CARE ADVICE given per Arm Swelling and Edema (Adult) guideline. Comments User: Karena Addison, RN Date/Time [name] Time): 01/04/2020 5:26:08 PM Pt reports redness is covering about half of forearm at this time Referrals [facility]

## 2020-01-05 NOTE — Telephone Encounter (Signed)
Noted, patient evaluated and treated at [facility], chart reviewed.

## 2020-01-06 LAB — NOVEL CORONAVIRUS, NAA: SARS-CoV-2, NAA: DETECTED — AB

## 2020-01-27 DIAGNOSIS — Z20822 Contact with and (suspected) exposure to covid-19: Secondary | ICD-10-CM | POA: Insufficient documentation

## 2020-01-27 HISTORY — DX: Contact with and (suspected) exposure to covid-19: Z20.822

## 2020-01-27 NOTE — Assessment & Plan Note (Signed)
No current associated asthma flare but recommended restarting singulair.   Likely COVID19  infection vs other viral infection. No clear sign of bacterial infection at this time.  Recommend testing ... pt scheduled. No SOB.  No red flags/need for ER visit or in-person exam at respiratory clinic at this time.Marland Kitchen    Pt mild/moderate risk for COVID complications given asthma If SOB begins symptoms worsening.. have low threshold for in-person exam, if severe shortness of breath ER visit recommended.  Can monitor Oxygen saturation at home with home monitor if able to obtain.  Go to ER if O2 sat < 90% on room air.  Reviewed home care and provided information through [place].  Recommended isolation until test returns. If returns positive 10 days quarantine recommended.  Provided info about prevention of spread of COVID 19.

## 2020-03-01 ENCOUNTER — Other Ambulatory Visit: Payer: Self-pay

## 2020-03-01 ENCOUNTER — Encounter: Payer: Self-pay | Admitting: Primary Care

## 2020-03-01 ENCOUNTER — Ambulatory Visit (INDEPENDENT_AMBULATORY_CARE_PROVIDER_SITE_OTHER): Payer: Managed Care, Other (non HMO) | Admitting: Primary Care

## 2020-03-01 ENCOUNTER — Ambulatory Visit (INDEPENDENT_AMBULATORY_CARE_PROVIDER_SITE_OTHER)
Admission: RE | Admit: 2020-03-01 | Discharge: 2020-03-01 | Disposition: A | Discharge status: Home / Self Care | Payer: Managed Care, Other (non HMO) | Source: Ambulatory Visit | Attending: Primary Care | Admitting: Primary Care

## 2020-03-01 VITALS — BP 116/70 | HR 61 | Temp 95.7°F | Ht 69.5 in | Wt 261.5 lb

## 2020-03-01 DIAGNOSIS — J45909 Unspecified asthma, uncomplicated: Secondary | ICD-10-CM | POA: Insufficient documentation

## 2020-03-01 DIAGNOSIS — M79602 Pain in left arm: Secondary | ICD-10-CM

## 2020-03-01 LAB — CBC WITH DIFFERENTIAL/PLATELET
Basophils Absolute: 0 10*3/uL (ref 0.0–0.1)
Basophils Relative: 0.7 % (ref 0.0–3.0)
Eosinophils Absolute: 0.2 10*3/uL (ref 0.0–0.7)
Eosinophils Relative: 4.3 % (ref 0.0–5.0)
HCT: 41.3 % (ref 36.0–46.0)
Hemoglobin: 13.8 g/dL (ref 12.0–15.0)
Lymphocytes Relative: 28.3 % (ref 12.0–46.0)
Lymphs Abs: 1.6 10*3/uL (ref 0.7–4.0)
MCHC: 33.4 g/dL (ref 30.0–36.0)
MCV: 86.6 fl (ref 78.0–100.0)
Monocytes Absolute: 0.5 10*3/uL (ref 0.1–1.0)
Monocytes Relative: 8 % (ref 3.0–12.0)
Neutro Abs: 3.4 10*3/uL (ref 1.4–7.7)
Neutrophils Relative %: 58.7 % (ref 43.0–77.0)
Platelets: 215 10*3/uL (ref 150.0–400.0)
RBC: 4.76 Mil/uL (ref 3.87–5.11)
RDW: 14.6 % (ref 11.5–15.5)
WBC: 5.7 10*3/uL (ref 4.0–10.5)

## 2020-03-01 MED ORDER — BUDESONIDE-FORMOTEROL FUMARATE 80-4.5 MCG/ACT IN AERO: 2.0000 | INHALATION_SPRAY | Freq: Two times a day (BID) | RESPIRATORY_TRACT | 5 refills | Status: DC

## 2020-03-01 MED ORDER — ALBUTEROL SULFATE HFA 108 (90 BASE) MCG/ACT IN AERS: 2.0000 | INHALATION_SPRAY | Freq: Four times a day (QID) | RESPIRATORY_TRACT | 0 refills | Status: DC | PRN

## 2020-03-01 NOTE — Patient Instructions (Signed)
Complete xray(s) and labs prior to leaving today. I will notify you of your results once received.  Start Ibuprofen 600 mg twice daily with food for 7 days.  I'll be in touch!  It was a pleasure to see you today!

## 2020-03-01 NOTE — Assessment & Plan Note (Signed)
Acute for the last 2-3 weeks, more intense than her typical pain.  Today there is no evidence for cellulitis. Doubt thrombus given low risk and overall examination. Suspect cervical spine or shoulder involvement with nerve impingement given radiculopathy.   Check plain films of cervical spine. Check CBC and D-dimer.  Add Ibuprofen 600 mg BID-TID x 1 week with food. She will update.

## 2020-03-01 NOTE — Progress Notes (Signed)
Subjective:    Patient ID: Kathleen Ballard, female    DOB: 1961-04-11, 59 y.o.   MRN: [id number]  HPI  This visit occurred during the SARS-CoV-2 public health emergency.  Safety protocols were in place, including screening questions prior to the visit, additional usage of staff PPE, and extensive cleaning of exam room while observing appropriate contact time as indicated for disinfecting solutions.   Ms. Dorsi is a 59 year old female with a history of bilateral breast cancer, bilateral mastectomies, peripheral edema, frequent headaches/migraines, asthma who presents today with a chief complaint of upper extremity pain and swelling. She is also needing refills of her inhalers.  History of bilateral mastectomies around 2012 with removeal of most of the lymph nodes to left upper extremity, and a few lymph nodes to the right. Intermittently has noticed swelling and discomfort to her extremities since lymph nodes removed in 2012.  Two to three weeks ago she started noticing left upper extremity pain to the medial humeral region with radiation down forearm. She has intermittent discomfort to her extremities that's been present since her surgery years ago, but the discomfort to her left upper extremity is different and more intense. She has noticed numbness to her left palmer hand and 3rd-5th digits  She denies erythema, swelling to left upper extremity, decrease in ROM, heavy lifting or trauma, neck pain, shoulder pain. She types a lot for her occupation. She follows with oncology with her last visit being in October 2020.  She's been taking Tylenol Arthritis for months without improvement. She did take an Advil PM last night with some improvement.   BP Readings from Last 3 Encounters:  03/01/20 116/70  01/04/20 116/72  09/27/19 (!) 139/91     Review of Systems  Constitutional: Negative for fever.  Musculoskeletal: Positive for myalgias.  Skin: Negative for color change and wound.       Past Medical History:  Diagnosis Date  . Allergic rhinitis 08/03/2016  . Allergy    Seasonal  . Blood transfusion without reported diagnosis   . Cancer ([place])    Stage III breast cancer - in remission  . Depression   . H/O bilateral mastectomy   . PUD (peptic ulcer disease)   . Thyroid disease    Graves' Disease     Social History   Socioeconomic History  . Marital status: Married    Spouse name: [name]  . Number of children: 2  . Years of education: 7  . Highest education level: Not on file  Occupational History  . Occupation: Biomedical engineer  Tobacco Use  . Smoking status: Never Smoker  . Smokeless tobacco: Never Used  Substance and Sexual Activity  . Alcohol use: No    Alcohol/week: 0.0 standard drinks  . Drug use: No  . Sexual activity: Not on file  Other Topics Concern  . Not on file  Social History Narrative   Born and raised in [place], [place]. Currently resides in a private residence with her husband [name]) and daughter. Other daughter lives independently in [place], [place].    Pets - 4 dogs and 1 cat;    Fun: Watch movies and cross-stitch.    Denies religious beliefs that would effect healthcare.    Social Determinants of Health   Financial Resource Strain:   . Difficulty of Paying Living Expenses: Not on file  Food Insecurity:   . Worried About Charity fundraiser in the Last Year: Not on file  . Ran Out of  Food in the Last Year: Not on file  Transportation Needs:   . Lack of Transportation (Medical): Not on file  . Lack of Transportation (Non-Medical): Not on file  Physical Activity:   . Days of Exercise per Week: Not on file  . Minutes of Exercise per Session: Not on file  Stress:   . Feeling of Stress : Not on file  Social Connections:   . Frequency of Communication with Friends and Family: Not on file  . Frequency of Social Gatherings with Friends and Family: Not on file  . Attends Religious Services: Not on file  . Active Member of Clubs or  Organizations: Not on file  . Attends Archivist Meetings: Not on file  . Marital Status: Not on file  Intimate Partner Violence:   . Fear of Current or Ex-Partner: Not on file  . Emotionally Abused: Not on file  . Physically Abused: Not on file  . Sexually Abused: Not on file    Past Surgical History:  Procedure Laterality Date  . ABDOMINAL HYSTERECTOMY     Partial - both ovaries remain.  Marland Kitchen BREAST BIOPSY    . CHOLECYSTECTOMY    . ESOPHAGEAL DILATION      Family History  Problem Relation Age of Onset  . Heart disease Mother 72       CAD/PTCA  . Heart disease Father        atrrial fibrillation  . Heart disease Maternal Grandmother   . Cancer Maternal Grandmother        Breast cancer  . COPD Maternal Grandfather     Allergies  Allergen Reactions  . Erythromycin Nausea Only    Makes her have a sick feeling.  Marland Kitchen Penicillins     Was told to never take during allergy testing    Current Outpatient Medications on File Prior to Visit  Medication Sig Dispense Refill  . propranolol ER (INDERAL LA) 80 MG 24 hr capsule TAKE 1 CAPSULE BY MOUTH DAILY FOR HEADACHE PREVENTION 90 capsule 1  . sertraline (ZOLOFT) 25 MG tablet TAKE 1 TABLET (25 MG TOTAL) BY MOUTH DAILY. FOR ANXIETY. 90 tablet 1  . SUMAtriptan (IMITREX) 25 MG tablet 1- 2 TABS AT MIGRAINE ONSET. REPEAT WITH 1 IN 2HRS IF HEADACHE PERSISTS OR RECURS NO MORE >4/24HRS 10 tablet 0   No current facility-administered medications on file prior to visit.    BP 116/70   Pulse 61   Temp (!) 95.7 F (35.4 C) (Temporal)   Ht 5' 9.5" (1.765 m)   Wt 261 lb 8 oz (118.6 kg)   SpO2 98%   BMI 38.06 kg/m    Objective:   Physical Exam  Constitutional: She appears well-nourished.  Respiratory: Effort normal and breath sounds normal.  Musculoskeletal:        General: Normal range of motion.     Left shoulder: No tenderness or pain. Normal strength.     Left elbow: No swelling or deformity. Normal range of motion. No  tenderness.     Left wrist: No tenderness. Normal range of motion.       Arms:     Comments: 5/5 strength to bilateral upper extremities.   Neurological:  Negative Phalen and Tinel sign.  Skin: Skin is warm and dry. No erythema.           Assessment & Plan:

## 2020-03-01 NOTE — Assessment & Plan Note (Signed)
Doing well on Symbicort for which she uses daily. No recent use of albuterol. Lungs clear on exam. Refills sent to pharmacy,

## 2020-03-02 LAB — D-DIMER, QUANTITATIVE: D-Dimer, Quant: 0.4 mcg/mL FEU (ref <0.50)

## 2020-03-21 DIAGNOSIS — M79602 Pain in left arm: Secondary | ICD-10-CM

## 2020-03-24 MED ORDER — PREDNISONE 20 MG PO TABS: ORAL_TABLET | ORAL | 0 refills | Status: DC

## 2020-05-13 ENCOUNTER — Other Ambulatory Visit: Payer: Self-pay | Admitting: Primary Care

## 2020-05-13 DIAGNOSIS — F419 Anxiety disorder, unspecified: Secondary | ICD-10-CM

## 2020-05-14 ENCOUNTER — Other Ambulatory Visit: Payer: Self-pay | Admitting: Primary Care

## 2020-05-14 DIAGNOSIS — J45909 Unspecified asthma, uncomplicated: Secondary | ICD-10-CM

## 2020-06-21 ENCOUNTER — Other Ambulatory Visit: Payer: Self-pay | Admitting: Primary Care

## 2020-06-21 DIAGNOSIS — R519 Headache, unspecified: Secondary | ICD-10-CM

## 2020-07-06 ENCOUNTER — Other Ambulatory Visit: Payer: Self-pay

## 2020-07-06 ENCOUNTER — Ambulatory Visit: Payer: 59 | Admitting: Primary Care

## 2020-07-06 VITALS — BP 118/72 | HR 82 | Temp 95.6°F | Ht 69.5 in | Wt 270.0 lb

## 2020-07-06 DIAGNOSIS — R1030 Lower abdominal pain, unspecified: Secondary | ICD-10-CM | POA: Insufficient documentation

## 2020-07-06 DIAGNOSIS — K219 Gastro-esophageal reflux disease without esophagitis: Secondary | ICD-10-CM | POA: Insufficient documentation

## 2020-07-06 LAB — CBC WITH DIFFERENTIAL/PLATELET
Basophils Absolute: 0 10*3/uL (ref 0.0–0.1)
Basophils Relative: 0.5 % (ref 0.0–3.0)
Eosinophils Absolute: 0.2 10*3/uL (ref 0.0–0.7)
Eosinophils Relative: 2.9 % (ref 0.0–5.0)
HCT: 40.1 % (ref 36.0–46.0)
Hemoglobin: 13.4 g/dL (ref 12.0–15.0)
Lymphocytes Relative: 24.6 % (ref 12.0–46.0)
Lymphs Abs: 1.5 10*3/uL (ref 0.7–4.0)
MCHC: 33.5 g/dL (ref 30.0–36.0)
MCV: 86.8 fl (ref 78.0–100.0)
Monocytes Absolute: 0.4 10*3/uL (ref 0.1–1.0)
Monocytes Relative: 7.1 % (ref 3.0–12.0)
Neutro Abs: 4 10*3/uL (ref 1.4–7.7)
Neutrophils Relative %: 64.9 % (ref 43.0–77.0)
Platelets: 212 10*3/uL (ref 150.0–400.0)
RBC: 4.61 Mil/uL (ref 3.87–5.11)
RDW: 13.3 % (ref 11.5–15.5)
WBC: 6.2 10*3/uL (ref 4.0–10.5)

## 2020-07-06 LAB — COMPREHENSIVE METABOLIC PANEL
ALT: 13 U/L (ref 0–35)
AST: 12 U/L (ref 0–37)
Albumin: 4.3 g/dL (ref 3.5–5.2)
Alkaline Phosphatase: 117 U/L (ref 39–117)
BUN: 18 mg/dL (ref 6–23)
CO2: 30 mEq/L (ref 19–32)
Calcium: 9.2 mg/dL (ref 8.4–10.5)
Chloride: 105 mEq/L (ref 96–112)
Creatinine, Ser: 0.75 mg/dL (ref 0.40–1.20)
GFR: 79.28 mL/min (ref >60.00)
Glucose, Bld: 123 mg/dL — ABNORMAL HIGH (ref 70–99)
Potassium: 4.1 mEq/L (ref 3.5–5.1)
Sodium: 140 mEq/L (ref 135–145)
Total Bilirubin: 0.4 mg/dL (ref 0.2–1.2)
Total Protein: 6.9 g/dL (ref 6.0–8.3)

## 2020-07-06 MED ORDER — OMEPRAZOLE 40 MG PO CPDR: 40.0000 mg | DELAYED_RELEASE_CAPSULE | Freq: Every day | ORAL | 0 refills | Status: DC

## 2020-07-06 NOTE — Progress Notes (Signed)
Subjective:    Patient ID: MAECYN PANNING, female    DOB: [birth year], 59 y.o.   MRN: [id number]  HPI  This visit occurred during the SARS-CoV-2 public health emergency.  Safety protocols were in place, including screening questions prior to the visit, additional usage of staff PPE, and extensive cleaning of exam room while observing appropriate contact time as indicated for disinfecting solutions.   Ms. Holsworth is a 59 year old female with a history of migraines, asthma, breast cancer, anxiety, cholecystectomy, GI ulcers who presents today with a chief complaint of abdominal pain.  She has a long history of intermittent ulcers within the GI tract, especially after breast cancer treatment with chemotherapy. Has recently been under a lot of stress.   Her pain is located to the bilateral mid and lower abdomen for which she describes as an ache, intermittent. She has nausea after most meals, without vomiting, no nausea without eating. She has chronic esophageal reflux that is no worse than usual. Recent symptoms of abdominal pain began about five days ago. She's been taking OTC Prevacid recently with temporary improvement, until she eats something, then nausea begins within 30-45 minutes, followed by abdominal ache.   She was previously following with GI through [facility] who has since retired. No recent visit since 2013/2014.   She denies changes in stools, bloody stools, decrease in appetite, fevers, chills, body aches, urinary symptoms. She would like to re-establish with a local GI provider.   BP Readings from Last 3 Encounters:  07/06/20 118/72  03/01/20 116/70  01/04/20 116/72     Review of Systems  Constitutional: Negative for chills, fatigue and fever.  Gastrointestinal: Positive for abdominal pain and nausea. Negative for blood in stool, constipation, diarrhea and vomiting.       Past Medical History:  Diagnosis Date  . Allergic rhinitis 08/03/2016  . Allergy    Seasonal    . Asthma with exacerbation 08/02/2016  . Blood transfusion without reported diagnosis   . Cancer ([place])    Stage III breast cancer - in remission  . Depression   . H/O bilateral mastectomy   . PUD (peptic ulcer disease)   . Thyroid disease    Graves' Disease     Social History   Socioeconomic History  . Marital status: Married    Spouse name: [name]  . Number of children: 2  . Years of education: 60  . Highest education level: Not on file  Occupational History  . Occupation: Biomedical engineer  Tobacco Use  . Smoking status: Never Smoker  . Smokeless tobacco: Never Used  Substance and Sexual Activity  . Alcohol use: No    Alcohol/week: 0.0 standard drinks  . Drug use: No  . Sexual activity: Not on file  Other Topics Concern  . Not on file  Social History Narrative   Born and raised in [place], [place]. Currently resides in a private residence with her husband [name]) and daughter. Other daughter lives independently in [place], [place].    Pets - 4 dogs and 1 cat;    Fun: Watch movies and cross-stitch.    Denies religious beliefs that would effect healthcare.    Social Determinants of Health   Financial Resource Strain:   . Difficulty of Paying Living Expenses:   Food Insecurity:   . Worried About Charity fundraiser in the Last Year:   . [place] in the Last Year:   Transportation Needs:   . Lack of  Transportation (Medical):   Marland Kitchen Lack of Transportation (Non-Medical):   Physical Activity:   . Days of Exercise per Week:   . Minutes of Exercise per Session:   Stress:   . Feeling of Stress :   Social Connections:   . Frequency of Communication with Friends and Family:   . Frequency of Social Gatherings with Friends and Family:   . Attends Religious Services:   . Active Member of Clubs or Organizations:   . Attends Archivist Meetings:   Marland Kitchen Marital Status:   Intimate Partner Violence:   . Fear of Current or Ex-Partner:   . Emotionally Abused:   Marland Kitchen  Physically Abused:   . Sexually Abused:     Past Surgical History:  Procedure Laterality Date  . ABDOMINAL HYSTERECTOMY     Partial - both ovaries remain.  Marland Kitchen BREAST BIOPSY    . CHOLECYSTECTOMY    . ESOPHAGEAL DILATION      Family History  Problem Relation Age of Onset  . Heart disease Mother 51       CAD/PTCA  . Heart disease Father        atrrial fibrillation  . Heart disease Maternal Grandmother   . Cancer Maternal Grandmother        Breast cancer  . COPD Maternal Grandfather     Allergies  Allergen Reactions  . Erythromycin Nausea Only    Makes her have a sick feeling.  Marland Kitchen Penicillins     Was told to never take during allergy testing    Current Outpatient Medications on File Prior to Visit  Medication Sig Dispense Refill  . albuterol (VENTOLIN HFA) 108 (90 Base) MCG/ACT inhaler INHALE 2 PUFFS EVERY 6 (SIX) HOURS AS NEEDED FOR WHEEZING OR SHORTNESS OF BREATH 18 g 0  . budesonide-formoterol (SYMBICORT) 80-4.5 MCG/ACT inhaler Inhale 2 puffs into the lungs 2 (two) times daily. 1 Inhaler 5  . propranolol ER (INDERAL LA) 80 MG 24 hr capsule TAKE 1 CAPSULE BY MOUTH DAILY FOR HEADACHE PREVENTION 30 capsule 5  . sertraline (ZOLOFT) 25 MG tablet TAKE 1 TABLET (25 MG TOTAL) BY MOUTH DAILY. FOR ANXIETY. 90 tablet 0  . SUMAtriptan (IMITREX) 25 MG tablet 1- 2 TABS AT MIGRAINE ONSET. REPEAT WITH 1 IN 2HRS IF HEADACHE PERSISTS OR RECURS NO MORE >4/24HRS 10 tablet 0   No current facility-administered medications on file prior to visit.    BP 118/72   Pulse 82   Temp (!) 95.6 F (35.3 C) (Temporal)   Ht 5' 9.5" (1.765 m)   Wt 270 lb (122.5 kg)   SpO2 98%   BMI 39.30 kg/m    Objective:   Physical Exam Constitutional:      General: She is not in acute distress.    Appearance: She is not ill-appearing.  Abdominal:     General: Bowel sounds are normal.     Palpations: Abdomen is soft.     Tenderness: There is abdominal tenderness in the right lower quadrant, epigastric area  and periumbilical area.  Skin:    General: Skin is warm and dry.  Neurological:     Mental Status: She is alert.            Assessment & Plan:

## 2020-07-06 NOTE — Assessment & Plan Note (Signed)
Acute for the last five days, also with nausea. Exam today with tenderness as noted, no acute distress, she doesn't appear sickly.  Start by checking CBC and CMP today. Rx for omeprazole 40 mg sent to pharmacy for history of GERD and GI ulcers.  She doesn't appear to have acute appendicitis, so will await labs. ED precautions provided. Referral placed to GI.

## 2020-07-06 NOTE — Patient Instructions (Signed)
Stop taking Prevacid.  Start omeprazole 40 mg daily for abdominal pain.  You will be contacted regarding your referral to GI.  Please let us know if you have not been contacted within two weeks.   Please update me in one week if no improvement in nausea.   It was a pleasure to see you today!

## 2020-07-06 NOTE — Assessment & Plan Note (Signed)
Chronic, frequent symptoms. Rx for omeprazole 40 mg sent to pharmacy. Referral placed to GI.

## 2020-08-02 ENCOUNTER — Other Ambulatory Visit: Payer: Self-pay | Admitting: Primary Care

## 2020-08-02 DIAGNOSIS — K219 Gastro-esophageal reflux disease without esophagitis: Secondary | ICD-10-CM

## 2020-08-02 DIAGNOSIS — R1030 Lower abdominal pain, unspecified: Secondary | ICD-10-CM

## 2020-08-12 ENCOUNTER — Other Ambulatory Visit: Payer: Self-pay | Admitting: Primary Care

## 2020-08-12 DIAGNOSIS — F419 Anxiety disorder, unspecified: Secondary | ICD-10-CM

## 2020-08-20 IMAGING — DX DG CERVICAL SPINE COMPLETE 4+V
6 series · 6 of 6 positions shown · non-contrast
Comparison: None.

CLINICAL DATA: Left upper extremity pain.

EXAM:
CERVICAL SPINE - COMPLETE 4+ VIEW

[c-spine lat]
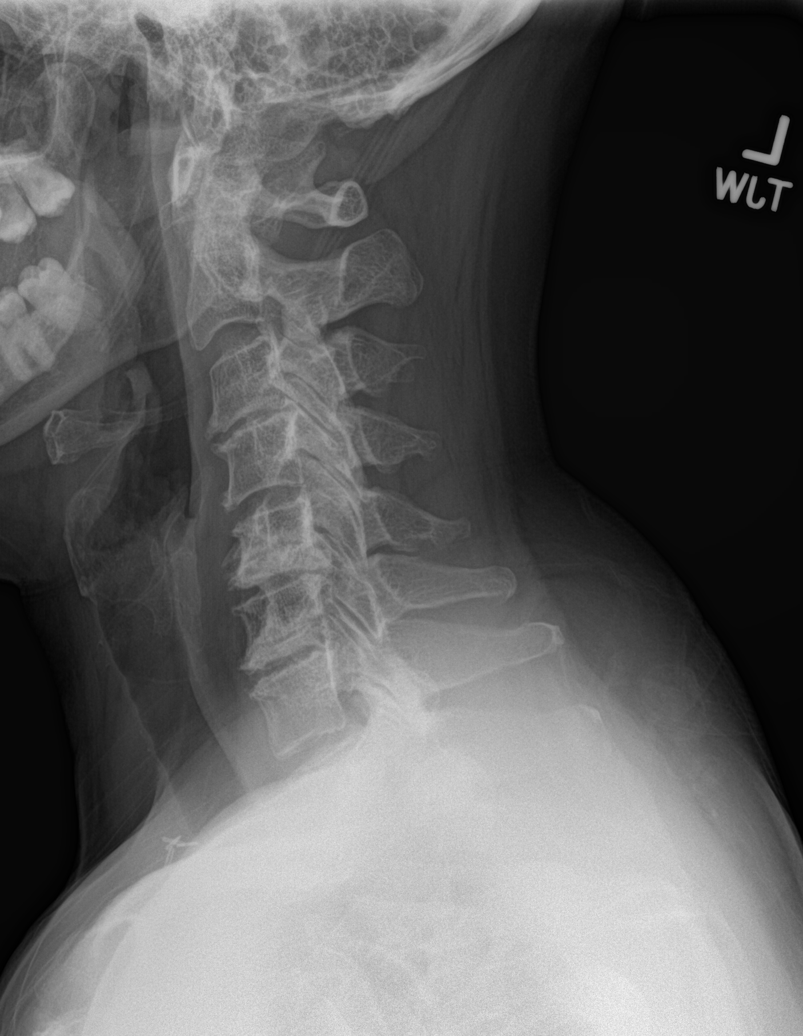

[c-spine obl (1 of 2)]
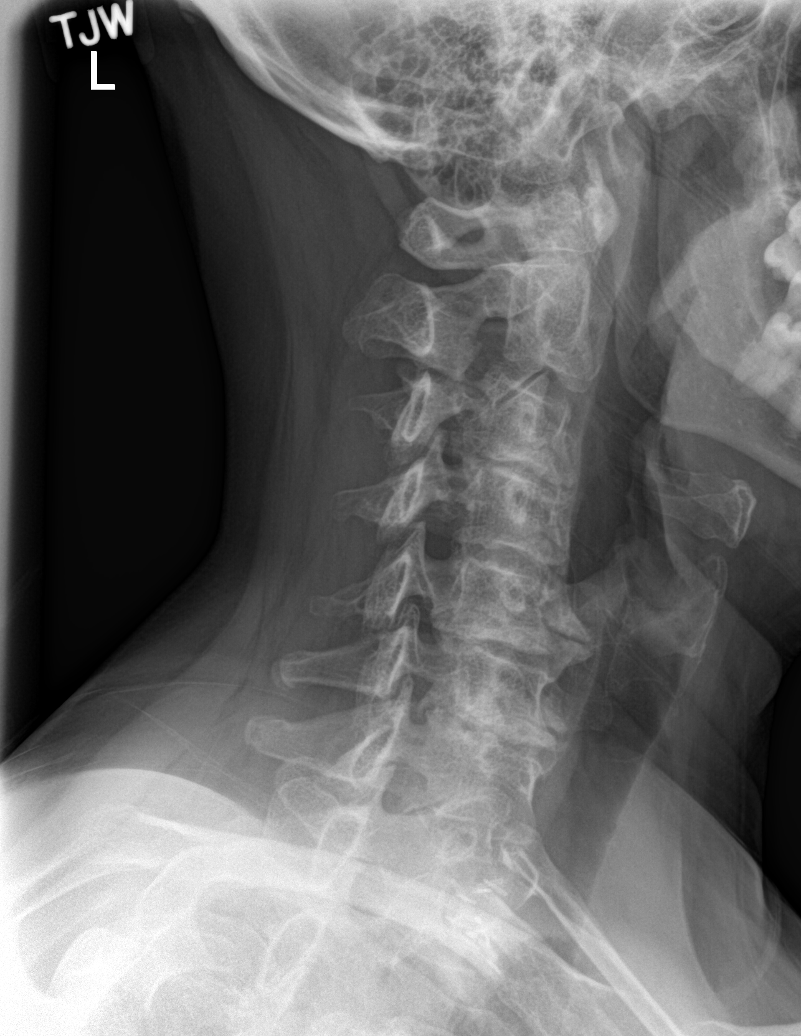

[c-spine obl (2 of 2)]
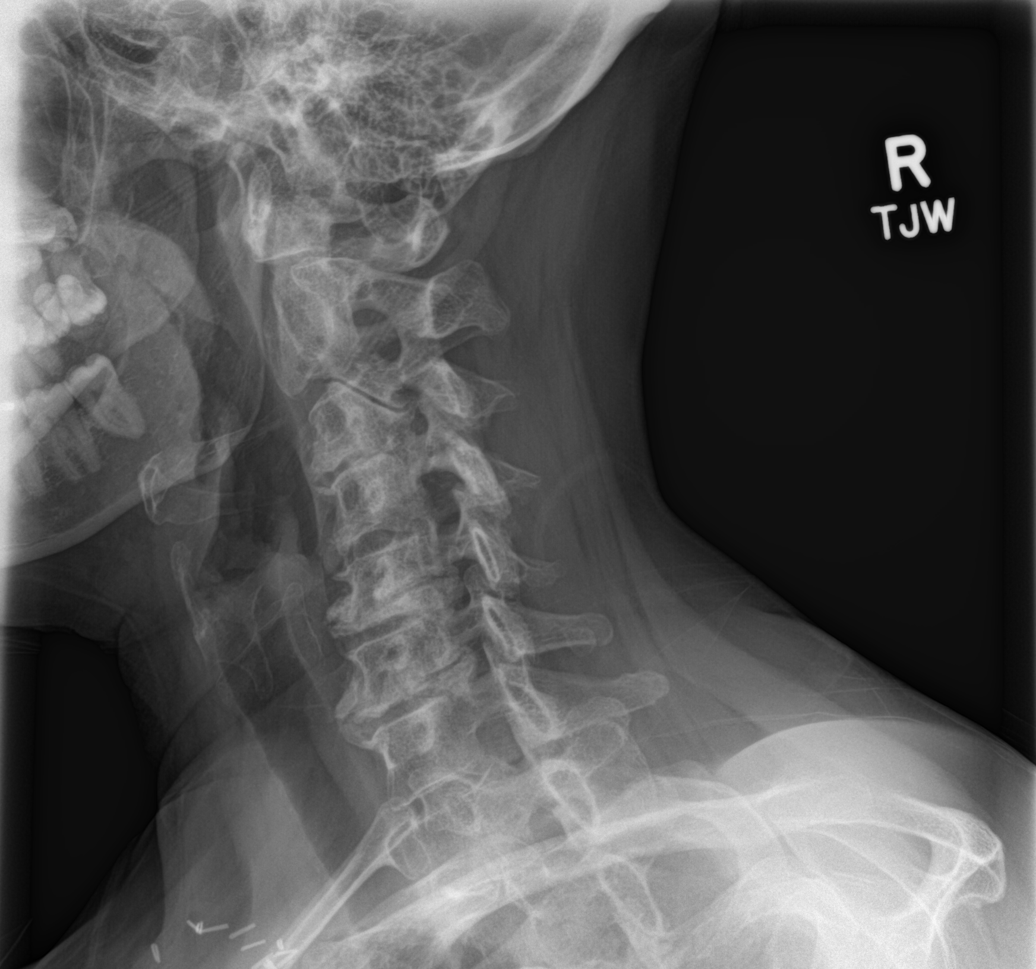

[c-spine ap]
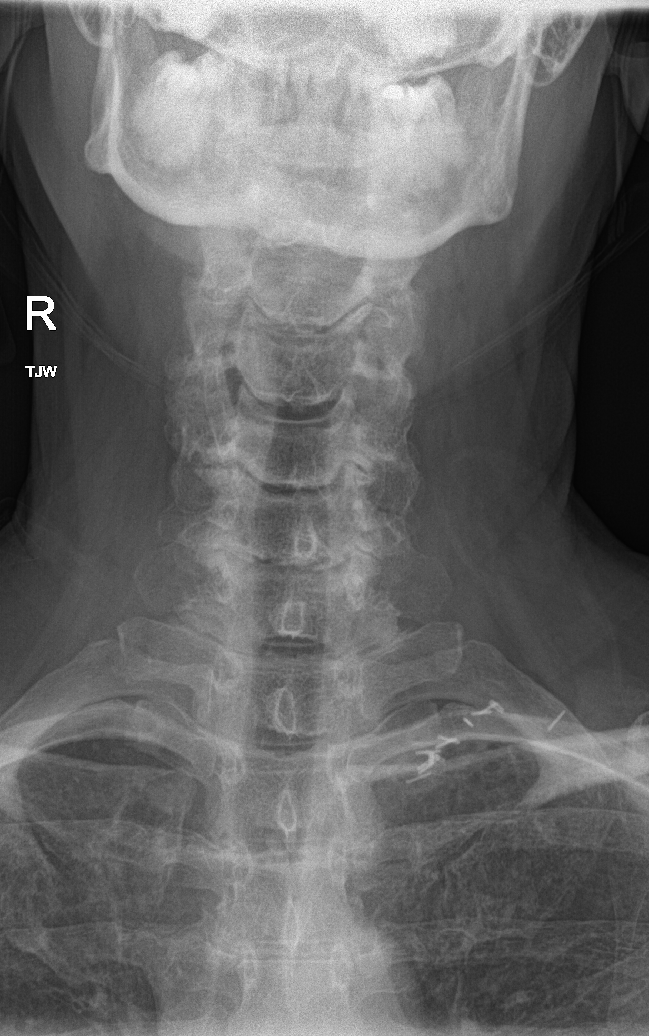

[c-spine open mouth]
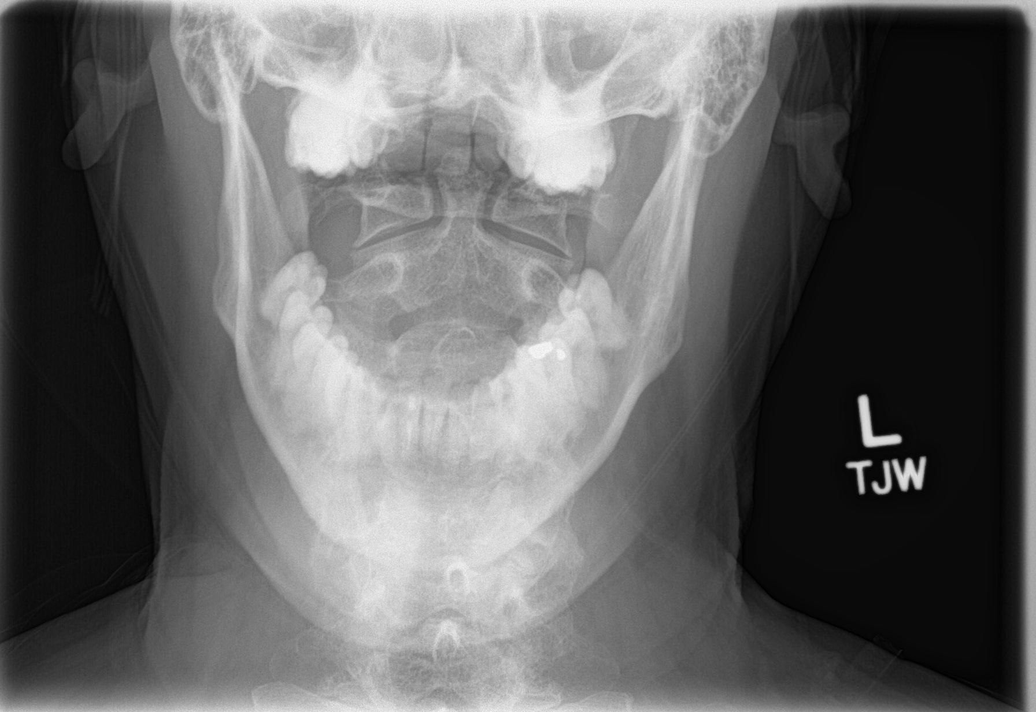

[c-spine swimmers]
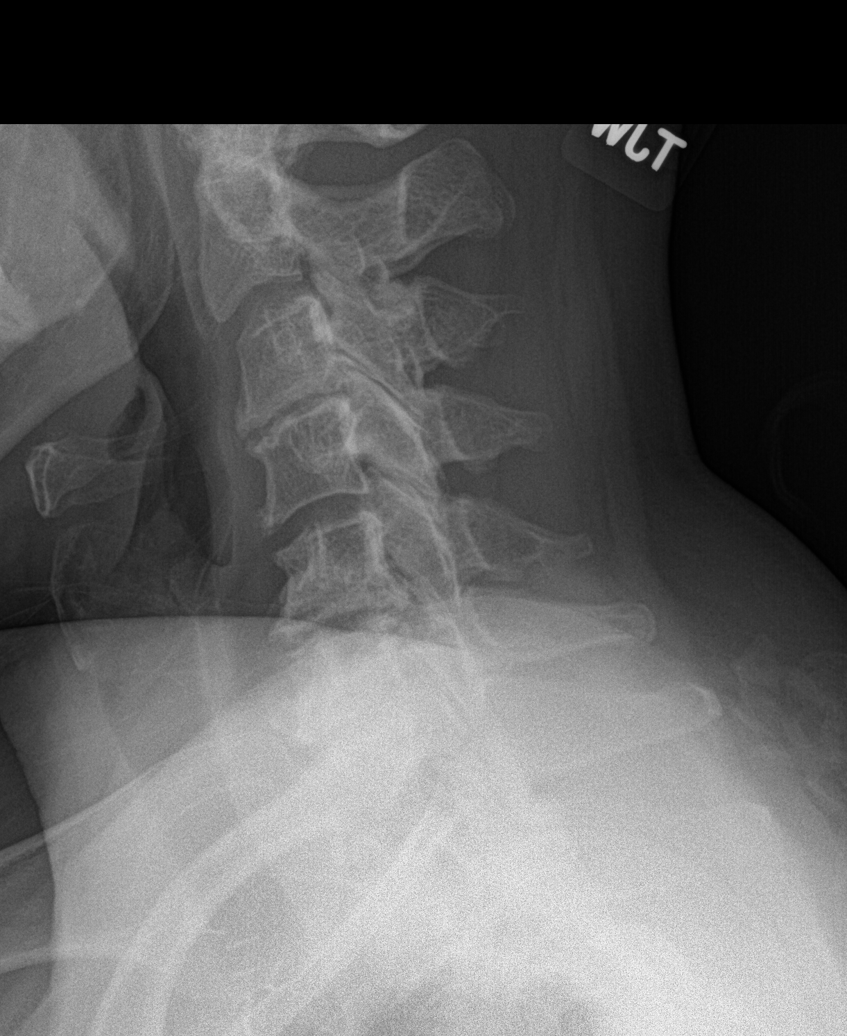

[6 of 6 positions shown; findings below may reference images not displayed]

FINDINGS: Slight straightening of the normal cervical lordosis. Alignment is
otherwise normal. Chronic disc space narrowing at C3-4, C5-6, and
C6-7. Slight narrowing of the left neural foramina at C3-4 and at
C6-7 due to uncinate spurs. Moderate narrowing of the right neural
foramina at C2-3, C5-6 and C6-7.

No severe facet arthritis. Prevertebral soft tissues are normal.
IMPRESSION: No acute abnormality. Multilevel degenerative disc and joint disease
as described.

## 2020-09-09 ENCOUNTER — Other Ambulatory Visit: Payer: Self-pay | Admitting: Primary Care

## 2020-09-09 DIAGNOSIS — F419 Anxiety disorder, unspecified: Secondary | ICD-10-CM

## 2020-09-10 ENCOUNTER — Other Ambulatory Visit: Payer: Self-pay | Admitting: Primary Care

## 2020-09-10 DIAGNOSIS — K219 Gastro-esophageal reflux disease without esophagitis: Secondary | ICD-10-CM

## 2020-09-10 DIAGNOSIS — R1030 Lower abdominal pain, unspecified: Secondary | ICD-10-CM

## 2020-09-26 ENCOUNTER — Inpatient Hospital Stay: Payer: 59 | Admitting: Hematology and Oncology

## 2020-09-26 NOTE — Assessment & Plan Note (Deleted)
Overlapping malignant neoplasm of female breast ([place]) Bilateral breast cancers(diagnosed and treated at [facility]) moved to [place] due to her job. Left: T3 N3 M0 stage III left IDC with questionable positive margins, 3/5 lymph nodes positive along with supraclavicular lymph node. 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  Follow-up in 1 year

## 2020-09-27 ENCOUNTER — Encounter: Payer: Self-pay | Admitting: Gastroenterology

## 2020-09-27 ENCOUNTER — Ambulatory Visit: Payer: 59 | Admitting: Gastroenterology

## 2020-10-08 ENCOUNTER — Other Ambulatory Visit: Payer: Self-pay | Admitting: Primary Care

## 2020-10-08 DIAGNOSIS — J45909 Unspecified asthma, uncomplicated: Secondary | ICD-10-CM

## 2020-10-08 DIAGNOSIS — F419 Anxiety disorder, unspecified: Secondary | ICD-10-CM

## 2020-10-09 ENCOUNTER — Other Ambulatory Visit: Payer: Self-pay | Admitting: Primary Care

## 2020-10-09 DIAGNOSIS — R1030 Lower abdominal pain, unspecified: Secondary | ICD-10-CM

## 2020-10-09 DIAGNOSIS — K219 Gastro-esophageal reflux disease without esophagitis: Secondary | ICD-10-CM

## 2020-10-10 NOTE — Progress Notes (Signed)
Patient Care Team: Pleas [name], NP as PCP - General (Internal Medicine)  DIAGNOSIS:    ICD-10-CM   1. 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[place])  09/2011 Initial Diagnosis   Palpable left breast mass status post bilateral mastectomies: Left: 5 cm IDC T3N3 (supraclavicular lymph node involvement) ER 0%, PR 20%, HER-2 positive ratio 2.9 there was extension into anterior and posterior margin, 3/5 lymph nodes positive; right: Incidentally discovered 0.8 cm grade 1 IDC with DCIS no prognostic panel done, 0/9 lymph nodes negative   11/27/2011 - 04/03/2012 Chemotherapy   Dose dense Adriamycin and Cytoxan followed by Taxol Herceptin, Herceptin maintenance completed 02/05/2013   03/2012 - 04/2012 Radiation Therapy   Adjuvant XRT   06/21/2012 - 06/21/2014 Anti-estrogen oral therapy   Could not tolerate multiple antiestrogen therapy options.     CHIEF COMPLIANT: Follow-up of bilateral breast cancer  INTERVAL HISTORY: Kathleen Ballard is a 59 y.o. with above-mentioned history of bilateral breast cancer treated with bilateral mastectomies, adjuvant chemotherapy, radiation, and who could not tolerate anti-estrogen therapy. She presents to the clinic today for follow-up.  Patient had Covid in January 2021.  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  She subsequently gave it to her daughter her husband and her mother-in-law.  Fortunately all recovered.  She has been since vaccinated.  She has not noticed any new pain or discomfort.  ALLERGIES:  is allergic to erythromycin and penicillins.  MEDICATIONS:  Current Outpatient Medications  Medication Sig Dispense Refill  . albuterol (VENTOLIN HFA) 108 (90 Base) MCG/ACT inhaler INHALE 2 PUFFS EVERY 6 (SIX) HOURS AS NEEDED FOR WHEEZING OR  SHORTNESS OF BREATH 18 g 0  . omeprazole (PRILOSEC) 40 MG capsule TAKE 1 CAPSULE BY MOUTH EVERY DAY 90 capsule 1  . propranolol ER (INDERAL LA) 80 MG 24 hr capsule TAKE 1 CAPSULE BY MOUTH DAILY FOR HEADACHE PREVENTION 30 capsule 5  . sertraline (ZOLOFT) 25 MG tablet TAKE 1 TABLET EVERY DAY AS NEEDED FOR ANXIETY 90 tablet 1  . SUMAtriptan (IMITREX) 25 MG tablet 1- 2 TABS AT MIGRAINE ONSET. REPEAT WITH 1 IN 2HRS IF HEADACHE PERSISTS OR RECURS NO MORE >4/24HRS 10 tablet 0  . SYMBICORT 80-4.5 MCG/ACT inhaler TAKE 2 PUFFS BY MOUTH TWICE A DAY 10.2 each 5   No current facility-administered medications for this visit.    PHYSICAL EXAMINATION: ECOG PERFORMANCE STATUS: 1 - Symptomatic but completely ambulatory  Vitals:   10/11/20 0839  BP: 140/71  Pulse: 82  Resp: 17  Temp: 98.1 F (36.7 C)  SpO2: 96%   Filed Weights   10/11/20 0839  Weight: 268 lb 1.6 oz (121.6 kg)    BREAST: No palpable masses or nodules in either right or left breasts. No palpable axillary supraclavicular or infraclavicular adenopathy no breast tenderness or nipple discharge. (exam performed in the presence of a chaperone)  LABORATORY DATA:  I have reviewed the data as listed CMP Latest Ref Rng & Units 07/06/2020 09/27/2019 01/14/2018  Glucose 70 - 99 mg/dL 123(H) - 109(H)  BUN 6 - 23 mg/dL 18 - 14  Creatinine 0.40 - 1.20 mg/dL 0.75 0.70 0.75  Sodium 135 - 145 mEq/L 140 - 141  Potassium 3.5 - 5.1 mEq/L 4.1 - 4.0  Chloride 96 -  112 mEq/L 105 - 104  CO2 19 - 32 mEq/L 30 - 29  Calcium 8.4 - 10.5 mg/dL 9.2 - 9.1  Total Protein 6.0 - 8.3 g/dL 6.9 - -  Total Bilirubin 0.2 - 1.2 mg/dL 0.4 - -  Alkaline Phos 39 - 117 U/L 117 - -  AST 0 - 37 U/L 12 - -  ALT 0 - 35 U/L 13 - -    Lab Results  Component Value Date   WBC 6.2 07/06/2020   HGB 13.4 07/06/2020   HCT 40.1 07/06/2020   MCV 86.8 07/06/2020   PLT 212.0 07/06/2020   NEUTROABS 4.0 07/06/2020    ASSESSMENT & PLAN:  Overlapping malignant neoplasm of female  breast ([place]) Bilateral breast cancers(diagnosed and treated at [facility]) moved to [place] due to her job. Left: T3 N3 M0 stage III left IDC with questionable positive margins, 3/5 lymph nodes positive along with supraclavicular lymph node. 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  Did not restart it because her ER was 0% and PR was 20% only. -------------------------------------------------------------------------------------------------------------------------------- Breast cancer surveillance: 1.Breast exam 10/11/2020  bilateral mastectomies no palpable lumps or nodules of concern 2.No role for imaging since she had bilateral mastectomies.  I discussed the importance of exercise of 30 minutes every day. Follow-up in 1 year   No orders of the defined types were placed in this encounter.  The patient has a good understanding of the overall plan. she agrees with it. she will call with any problems that may develop before the next visit here.  Total time spent: 20 mins including face to face time and time spent for planning, charting and coordination of care  Nicholas Lose, MD 10/11/2020  I, Cloyde Reams Dorshimer, am acting as scribe for Dr. Nicholas Lose.  I have reviewed the above documentation for accuracy and completeness, and I agree with the above.

## 2020-10-11 ENCOUNTER — Inpatient Hospital Stay: Payer: 59 | Attending: Hematology and Oncology | Admitting: Hematology and Oncology

## 2020-10-11 ENCOUNTER — Other Ambulatory Visit: Payer: Self-pay

## 2020-10-11 ENCOUNTER — Telehealth: Payer: Self-pay | Admitting: Hematology and Oncology

## 2020-10-11 DIAGNOSIS — Z853 Personal history of malignant neoplasm of breast: Secondary | ICD-10-CM | POA: Diagnosis present

## 2020-10-11 DIAGNOSIS — Z8616 Personal history of COVID-19: Secondary | ICD-10-CM | POA: Insufficient documentation

## 2020-10-11 DIAGNOSIS — Z923 Personal history of irradiation: Secondary | ICD-10-CM | POA: Insufficient documentation

## 2020-10-11 DIAGNOSIS — Z170 Estrogen receptor positive status [ER+]: Secondary | ICD-10-CM | POA: Diagnosis not present

## 2020-10-11 DIAGNOSIS — C50812 Malignant neoplasm of overlapping sites of left female breast: Secondary | ICD-10-CM | POA: Diagnosis not present

## 2020-10-11 DIAGNOSIS — Z9221 Personal history of antineoplastic chemotherapy: Secondary | ICD-10-CM | POA: Insufficient documentation

## 2020-10-11 DIAGNOSIS — Z9013 Acquired absence of bilateral breasts and nipples: Secondary | ICD-10-CM | POA: Insufficient documentation

## 2020-10-11 DIAGNOSIS — C50811 Malignant neoplasm of overlapping sites of right female breast: Secondary | ICD-10-CM | POA: Diagnosis not present

## 2020-10-11 NOTE — Telephone Encounter (Signed)
Scheduled appointment per 10/13 los. Spoke to patient who is aware of appointment date and time.

## 2020-10-11 NOTE — Assessment & Plan Note (Signed)
Bilateral breast cancers(diagnosed and treated at [facility]) moved to [place] due to her job. Left: T3 N3 M0 stage III left IDC with questionable positive margins, 3/5 lymph nodes positive along with supraclavicular lymph node. 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  Follow-up in 1 year

## 2021-01-20 ENCOUNTER — Other Ambulatory Visit: Payer: Self-pay | Admitting: Primary Care

## 2021-01-20 DIAGNOSIS — R519 Headache, unspecified: Secondary | ICD-10-CM

## 2021-01-22 NOTE — Telephone Encounter (Signed)
Kathleen Ballard, this patient needs to be seen for general follow up, she will need this for ongoing refills of her medications. Please schedule within the next month.

## 2021-01-24 NOTE — Telephone Encounter (Signed)
Called patient to schedule OV. LVM to call back and schedule, as per DPR.

## 2021-01-25 NOTE — Telephone Encounter (Signed)
Called patient and scheduled for OV.

## 2021-02-13 ENCOUNTER — Encounter: Payer: Self-pay | Admitting: Primary Care

## 2021-02-13 ENCOUNTER — Telehealth (INDEPENDENT_AMBULATORY_CARE_PROVIDER_SITE_OTHER): Payer: 59 | Admitting: Primary Care

## 2021-02-13 VITALS — Temp 97.6°F | Ht 69.5 in | Wt 245.0 lb

## 2021-02-13 DIAGNOSIS — K219 Gastro-esophageal reflux disease without esophagitis: Secondary | ICD-10-CM

## 2021-02-13 DIAGNOSIS — Z170 Estrogen receptor positive status [ER+]: Secondary | ICD-10-CM

## 2021-02-13 DIAGNOSIS — J45909 Unspecified asthma, uncomplicated: Secondary | ICD-10-CM | POA: Diagnosis not present

## 2021-02-13 DIAGNOSIS — C50812 Malignant neoplasm of overlapping sites of left female breast: Secondary | ICD-10-CM

## 2021-02-13 DIAGNOSIS — G43709 Chronic migraine without aura, not intractable, without status migrainosus: Secondary | ICD-10-CM

## 2021-02-13 DIAGNOSIS — F419 Anxiety disorder, unspecified: Secondary | ICD-10-CM

## 2021-02-13 DIAGNOSIS — C50811 Malignant neoplasm of overlapping sites of right female breast: Secondary | ICD-10-CM

## 2021-02-13 DIAGNOSIS — R1030 Lower abdominal pain, unspecified: Secondary | ICD-10-CM | POA: Diagnosis not present

## 2021-02-13 DIAGNOSIS — R519 Headache, unspecified: Secondary | ICD-10-CM

## 2021-02-13 MED ORDER — ALBUTEROL SULFATE HFA 108 (90 BASE) MCG/ACT IN AERS: INHALATION_SPRAY | RESPIRATORY_TRACT | 0 refills | Status: DC

## 2021-02-13 MED ORDER — SERTRALINE HCL 50 MG PO TABS: 50.0000 mg | ORAL_TABLET | Freq: Every day | ORAL | 3 refills | Status: DC

## 2021-02-13 MED ORDER — OMEPRAZOLE 20 MG PO CPDR: DELAYED_RELEASE_CAPSULE | ORAL | 1 refills | Status: DC

## 2021-02-13 MED ORDER — PROPRANOLOL HCL ER 120 MG PO CP24: 120.0000 mg | ORAL_CAPSULE | Freq: Every day | ORAL | 3 refills | Status: DC

## 2021-02-13 NOTE — Patient Instructions (Signed)
We increase the dose of your propanolol ER to 120 mg.  Take this daily for headache prevention.  We increase the dose of your sertraline to 50 mg for anxiety.  We reduce the dose of your omeprazole to 20 mg, you may take an extra 20 mg dose if needed.  Please update me regarding your headaches, anxiety, heartburn.  It was a pleasure to see you today! [name], NP-C

## 2021-02-13 NOTE — Assessment & Plan Note (Signed)
More persistent since COVID-19 infection last year.  Doing well on Symbicort 80-4.5 mcg, 2 puffs twice daily.  Continue same.  Infrequent use of albuterol inhaler, refill provided today

## 2021-02-13 NOTE — Assessment & Plan Note (Signed)
Daily headaches despite propanolol ER 80 mg.  Given that she does find some improvement with propanolol, we will trial a dose increase to 120 mg.  She will update. Continue sumatriptan 25 mg as needed.

## 2021-02-13 NOTE — Assessment & Plan Note (Signed)
We will trial a dose reduction to 20 mg of omeprazole, okay to add in extra 20 mg if needed.  She will update.

## 2021-02-13 NOTE — Progress Notes (Signed)
Subjective:    Patient ID: Kathleen Ballard, female    DOB: 02-01-1961, 60 y.o.   MRN: [id number]  HPI  Virtual Visit via Video Note  I connected with Kathleen Ballard on 02/13/21 at  8:00 AM EST by a video enabled telemedicine application and verified that I am speaking with the correct person using two identifiers.  Location: Patient: Work Provider: Office Participants: Patient and myself   I discussed the limitations of evaluation and management by telemedicine and the availability of in person appointments. The patient expressed understanding and agreed to proceed.  History of Present Illness:  Kathleen Ballard is a 60 year old female with a history of migraines, asthma, GERD, breast cancer who presents today for follow up and medication refills.  1) Migraines: Currently managed on propranolol ER 80 mg for which she takes daily for migraine prevention, she also takes Excedrin Migraine daily for headaches. She thinks that the propranolol ER 80 mg is helping some, but is not finding relief and headaches.  She reserves her sumatriptan for severe migraines/headaches.  She does believe that the majority of her headaches are secondary to stress at work.  2) Anxiety: Currently managed on sertraline 25 mg.  Over the last several months she has experienced an increase in anxiety.  She endorses a lot of stress at work which has contributed.  She believes that she would benefit from a dose increase of her sertraline.  3) Asthma: Currently managed on Symbicort 80-4.5 mcg and is inhaling 2 puffs twice daily.  Infrequent use of albuterol inhaler, does need an updated prescription.  She has noticed that her asthma has been worse since her COVID-19 infection last year.  Overall she feels well managed on Symbicort.  4) GERD: Chronic PPI use and is managed on omeprazole 40 mg.  Does very well on this regimen, has not tried a lower dose in quite some time.  She believes that she may do well on 20 mg of  omeprazole with an extra 20 mg if needed.   Observations/Objective:  Alert and oriented. Appears well, not sickly. No distress. Speaking in complete sentences.   Assessment and Plan:  See problem based charting.  Follow Up Instructions:  We increase the dose of your propanolol ER to 120 mg.  Take this daily for headache prevention.  We increase the dose of your sertraline to 50 mg for anxiety.  We reduce the dose of your omeprazole to 20 mg, you may take an extra 20 mg dose if needed.  Please update me regarding your headaches, anxiety, heartburn.  It was a pleasure to see you today! Kathleen Bossier, NP-C    I discussed the assessment and treatment plan with the patient. The patient was provided an opportunity to ask questions and all were answered. The patient agreed with the plan and demonstrated an understanding of the instructions.   The patient was advised to call back or seek an in-person evaluation if the symptoms worsen or if the condition fails to improve as anticipated.    Kathleen [name], NP    Review of Systems  Respiratory: Negative for shortness of breath.   Cardiovascular: Negative for chest pain.  Neurological: Positive for headaches.  Psychiatric/Behavioral: The patient is nervous/anxious.        Past Medical History:  Diagnosis Date  . Allergic rhinitis 08/03/2016  . Allergy    Seasonal  . Asthma with exacerbation 08/02/2016  . Blood transfusion without reported diagnosis   .  Cancer ([place])    Stage III breast cancer - in remission  . Depression   . H/O bilateral mastectomy   . PUD (peptic ulcer disease)   . Thyroid disease    Graves' Disease     Social History   Socioeconomic History  . Marital status: Married    Spouse name: Kathleen Ballard  . Number of children: 2  . Years of education: 25  . Highest education level: Not on file  Occupational History  . Occupation: Biomedical engineer  Tobacco Use  . Smoking status: Never Smoker  . Smokeless  tobacco: Never Used  Substance and Sexual Activity  . Alcohol use: No    Alcohol/week: 0.0 standard drinks  . Drug use: No  . Sexual activity: Not on file  Other Topics Concern  . Not on file  Social History Narrative   Born and raised in [place], [place]. Currently resides in a private residence with her husband Kathleen Ballard) and daughter. Other daughter lives independently in [place], [place].    Pets - 4 dogs and 1 cat;    Fun: Watch movies and cross-stitch.    Denies religious beliefs that would effect healthcare.    Social Determinants of Health   Financial Resource Strain: Not on file  Food Insecurity: Not on file  Transportation Needs: Not on file  Physical Activity: Not on file  Stress: Not on file  Social Connections: Not on file  Intimate Partner Violence: Not on file    Past Surgical History:  Procedure Laterality Date  . ABDOMINAL HYSTERECTOMY     Partial - both ovaries remain.  Marland Kitchen BREAST BIOPSY    . CHOLECYSTECTOMY    . ESOPHAGEAL DILATION      Family History  Problem Relation Age of Onset  . Heart disease Mother 16       CAD/PTCA  . Heart disease Father        atrrial fibrillation  . Heart disease Maternal Grandmother   . Cancer Maternal Grandmother        Breast cancer  . COPD Maternal Grandfather     Allergies  Allergen Reactions  . Erythromycin Nausea Only    Makes her have a sick feeling.  Marland Kitchen Penicillins     Was told to never take during allergy testing    Current Outpatient Medications on File Prior to Visit  Medication Sig Dispense Refill  . albuterol (VENTOLIN HFA) 108 (90 Base) MCG/ACT inhaler INHALE 2 PUFFS EVERY 6 (SIX) HOURS AS NEEDED FOR WHEEZING OR SHORTNESS OF BREATH 18 g 0  . SUMAtriptan (IMITREX) 25 MG tablet 1- 2 TABS AT MIGRAINE ONSET. REPEAT WITH 1 IN 2HRS IF HEADACHE PERSISTS OR RECURS NO MORE >4/24HRS 10 tablet 0  . SYMBICORT 80-4.5 MCG/ACT inhaler TAKE 2 PUFFS BY MOUTH TWICE A DAY 10.2 each 5  . [DISCONTINUED] omeprazole (PRILOSEC) 40  MG capsule TAKE 1 CAPSULE BY MOUTH EVERY DAY 90 capsule 1  . [DISCONTINUED] propranolol ER (INDERAL LA) 80 MG 24 hr capsule TAKE 1 CAPSULE BY MOUTH DAILY FOR HEADACHE PREVENTION 90 capsule 0  . [DISCONTINUED] sertraline (ZOLOFT) 25 MG tablet TAKE 1 TABLET EVERY DAY AS NEEDED FOR ANXIETY 90 tablet 1   No current facility-administered medications on file prior to visit.    Temp 97.6 F (36.4 C) (Temporal)   Ht 5' 9.5" (1.765 m)   Wt 245 lb (111.1 kg)   BMI 35.66 kg/m    Objective:   Physical Exam Pulmonary:     Effort: Pulmonary effort  is normal.  Neurological:     Mental Status: She is alert and oriented to person, place, and time.  Psychiatric:        Mood and Affect: Mood normal.            Assessment & Plan:

## 2021-02-13 NOTE — Assessment & Plan Note (Signed)
Increase stress at work.  Agree to increase sertraline to 50 mg, she will update.

## 2021-02-13 NOTE — Assessment & Plan Note (Signed)
Follows with oncology annually, history of bilateral mastectomy.

## 2021-03-08 ENCOUNTER — Other Ambulatory Visit: Payer: Self-pay | Admitting: Primary Care

## 2021-03-08 DIAGNOSIS — R519 Headache, unspecified: Secondary | ICD-10-CM

## 2021-05-17 DIAGNOSIS — G43709 Chronic migraine without aura, not intractable, without status migrainosus: Secondary | ICD-10-CM

## 2021-05-17 MED ORDER — SUMATRIPTAN SUCCINATE 25 MG PO TABS: ORAL_TABLET | ORAL | 0 refills | Status: AC

## 2021-06-01 ENCOUNTER — Other Ambulatory Visit: Payer: Self-pay | Admitting: Primary Care

## 2021-06-01 DIAGNOSIS — J45909 Unspecified asthma, uncomplicated: Secondary | ICD-10-CM

## 2021-06-14 ENCOUNTER — Other Ambulatory Visit: Payer: Self-pay | Admitting: Primary Care

## 2021-06-14 DIAGNOSIS — G43709 Chronic migraine without aura, not intractable, without status migrainosus: Secondary | ICD-10-CM

## 2021-08-29 ENCOUNTER — Other Ambulatory Visit: Payer: Self-pay

## 2021-08-29 ENCOUNTER — Encounter: Payer: Self-pay | Admitting: Primary Care

## 2021-08-29 ENCOUNTER — Ambulatory Visit: Payer: 59 | Admitting: Primary Care

## 2021-08-29 VITALS — BP 136/74 | HR 82 | Temp 97.6°F | Ht 70.0 in | Wt 253.0 lb

## 2021-08-29 DIAGNOSIS — M25512 Pain in left shoulder: Secondary | ICD-10-CM | POA: Diagnosis not present

## 2021-08-29 DIAGNOSIS — Z23 Encounter for immunization: Secondary | ICD-10-CM | POA: Diagnosis not present

## 2021-08-29 DIAGNOSIS — G8929 Other chronic pain: Secondary | ICD-10-CM | POA: Diagnosis not present

## 2021-08-29 MED ORDER — METHYLPREDNISOLONE ACETATE 80 MG/ML IJ SUSP
80.0000 mg | Freq: Once | INTRAMUSCULAR | Status: AC
  Administered 2021-08-29: 80 mg via INTRAMUSCULAR

## 2021-08-29 NOTE — Addendum Note (Signed)
Addended by: Francella Solian on: 08/29/2021 09:14 AM   Modules accepted: Orders

## 2021-08-29 NOTE — Progress Notes (Signed)
Subjective:    Patient ID: Kathleen Ballard, female    DOB: 05-Nov-1961, 60 y.o.   MRN: [id number]  HPI  Kathleen Ballard is a very pleasant 60 y.o. female with a history of migraines, asthma, GERD, breast cancer, peripheral edema, anxiety who presents today   Her pain is located to the left lateral upper arm, distal to the shoulder, for which began about 2-3 months ago. She describes her pain as achy with radiation proximal to her elbow. Her pain is present with movement only.   She's also noticed decrease in ROM with pain upon abduction. 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 She works in childcare, does lifting frequently, has tried to limit lifting since her symptoms began.   She is taking Tylenol Arthritis in the morning, and Ibuprofen 800 BID for chronic neck and back pain. This has helped with her left upper extremity pain.   She denies injury, numbness/tingling, weakness.    BP Readings from Last 3 Encounters:  08/29/21 136/74  10/11/20 140/71  07/06/20 118/72      Review of Systems  Musculoskeletal:  Positive for arthralgias and myalgias.  Skin:  Negative for color change.  Neurological:  Negative for weakness and numbness.        Past Medical History:  Diagnosis Date  . Allergic rhinitis 08/03/2016  . Allergy    Seasonal  . Asthma with exacerbation 08/02/2016  . Blood transfusion without reported diagnosis   . Cancer ([place])    Stage III breast cancer - in remission  . Depression   . H/O bilateral mastectomy   . PUD (peptic ulcer disease)   . Thyroid disease    Graves' Disease    Social History   Socioeconomic History  . Marital status: Married    Spouse name: [name]  . Number of children: 2  . Years of education: 44  . Highest education level: Not on file  Occupational History  . Occupation: Biomedical engineer  Tobacco Use  . Smoking status: Never  . Smokeless tobacco: Never  Substance and Sexual Activity  . Alcohol use: No     Alcohol/week: 0.0 standard drinks  . Drug use: No  . Sexual activity: Not on file  Other Topics Concern  . Not on file  Social History Narrative   Born and raised in [place], [place]. Currently resides in a private residence with her husband [name]) and daughter. Other daughter lives independently in [place], [place].    Pets - 4 dogs and 1 cat;    Fun: Watch movies and cross-stitch.    Denies religious beliefs that would effect healthcare.    Social Determinants of Health   Financial Resource Strain: Not on file  Food Insecurity: Not on file  Transportation Needs: Not on file  Physical Activity: Not on file  Stress: Not on file  Social Connections: Not on file  Intimate Partner Violence: Not on file    Past Surgical History:  Procedure Laterality Date  . ABDOMINAL HYSTERECTOMY     Partial - both ovaries remain.  Marland Kitchen BREAST BIOPSY    . CHOLECYSTECTOMY    . ESOPHAGEAL DILATION      Family History  Problem Relation Age of Onset  . Heart disease Mother 50       CAD/PTCA  . Heart disease Father        atrrial fibrillation  . Heart disease Maternal Grandmother   . Cancer Maternal Grandmother  Breast cancer  . COPD Maternal Grandfather     Allergies  Allergen Reactions  . Erythromycin Nausea Only    Makes her have a sick feeling.  Marland Kitchen Penicillins     Was told to never take during allergy testing    Current Outpatient Medications on File Prior to Visit  Medication Sig Dispense Refill  . albuterol (VENTOLIN HFA) 108 (90 Base) MCG/ACT inhaler INHALE 2 PUFFS EVERY 6 (SIX) HOURS AS NEEDED FOR WHEEZING OR SHORTNESS OF BREATH 18 g 0  . omeprazole (PRILOSEC) 20 MG capsule Take 1 capsule by mouth once or twice daily for heartburn. 180 capsule 1  . propranolol ER (INDERAL LA) 120 MG 24 hr capsule Take 1 capsule (120 mg total) by mouth daily. For headache prevention 90 capsule 3  . sertraline (ZOLOFT) 50 MG tablet Take 1 tablet (50 mg total) by mouth daily. For anxiety. 90 tablet 3   . SUMAtriptan (IMITREX) 25 MG tablet 1- 2 TABS AT MIGRAINE ONSET. REPEAT WITH 1-2 tablets in 2HRS IF HEADACHE PERSISTS OR RECURS NO MORE >4/24HRS 10 tablet 0  . SYMBICORT 80-4.5 MCG/ACT inhaler TAKE 2 PUFFS BY MOUTH TWICE A DAY 10.2 each 5   No current facility-administered medications on file prior to visit.    BP 136/74   Pulse 82   Temp 97.6 F (36.4 C) (Temporal)   Ht 5\' 10"  (1.778 m)   Wt 253 lb (114.8 kg)   SpO2 97%   BMI 36.30 kg/m  Objective:   Physical Exam Musculoskeletal:       Arms:     Comments: Decrease in ROM with pain to left shoulder and upper extremity with forward and posterior abduction.  Pain to left shoulder with left empty can test.   5/5 strength to bilateral upper extremities.   Skin:    General: Skin is warm and dry.  Neurological:     Mental Status: She is alert.          Assessment & Plan:      This visit occurred during the SARS-CoV-2 public health emergency.  Safety protocols were in place, including screening questions prior to the visit, additional usage of staff PPE, and extensive cleaning of exam room while observing appropriate contact time as indicated for disinfecting solutions.

## 2021-08-29 NOTE — Assessment & Plan Note (Addendum)
To left shoulder with referred pain to left upper arm.  Suspect bursitis at minimum, may even have small rotator cuff tear.  Discussed treatment options including PT, steroid injections, sports medicine evaluation. Offered Rx NSAID treatment for which she kindly declines, OTC treatment is effective.   She kindly declines PT due to time constraints with work. She will see our sports medicine physician. She does agree to IM Depo Medrol, 80 mg provided.   Handouts provided for home PT for bursitis.

## 2021-08-29 NOTE — Patient Instructions (Addendum)
Schedule an appointment with Dr. Lorelei Pont to evaluate your shoulder.  Try the physical therapy exercises as discussed.   Do not exceed 2400 mg of Ibuprofen and 3000 mg Tylenol in 24 hours.   It was a pleasure to see you today!  Adhesive Capsulitis Adhesive capsulitis, also called frozen shoulder, causes the shoulder to become stiff and painful to move. This condition happens when there is inflammation of the tendons and ligaments that surround the shoulder joint (shoulder capsule). What are the causes? This condition may be caused by: An injury to your shoulder joint. Straining your shoulder. Not moving your shoulder for a period of time. This can happen if your arm was injured or in a sling. Long-standing conditions, such as: Diabetes. Thyroid problems. Heart disease. Stroke. Rheumatoid arthritis. Lung disease. In some cases, the cause is not known. What increases the risk? You are more likely to develop this condition if you are: A woman. Older than 58 years of age. What are the signs or symptoms? Symptoms of this condition include: Pain in your shoulder when you move your arm. There may also be pain when parts of your shoulder are touched. The pain may be worse at night or when you are resting. A sore or aching shoulder. The inability to move your shoulder normally. Muscle spasms. How is this diagnosed? This condition is diagnosed with a physical exam and imaging tests, such as an X-ray or MRI. How is this treated? This condition may be treated with: Treatment of the underlying cause or condition. Medicine. Medicine may be given to relieve pain, inflammation, or muscle spasms. Steroid injections into the shoulder joint. Physical therapy. This involves performing exercises to get the shoulder moving again. Acupuncture. This is a type of treatment that involves stimulating specific points on your body by inserting thin needles through your skin. Shoulder manipulation. This  is a procedure to move the shoulder into another position. It is done after you are given a medicine to make you fall asleep (general anesthetic). The joint may also be injected with salt water at high pressure to break down scarring. Surgery. This may be done in severe cases when other treatments have failed. Although most people recover completely from adhesive capsulitis, some may not regain full shoulder movement. Follow these instructions at home: Managing pain, stiffness, and swelling   If directed, put ice on the injured area: Put ice in a plastic bag. Place a towel between your skin and the bag. Leave the ice on for 20 minutes, 2-3 times per day. If directed, apply heat to the affected area before you exercise. Use the heat source that your health care provider recommends, such as a moist heat pack or a heating pad. Place a towel between your skin and the heat source. Leave the heat on for 20-30 minutes. Remove the heat if your skin turns bright red. This is especially important if you are unable to feel pain, heat, or cold. You may have a greater risk of getting burned. General instructions Take over-the-counter and prescription medicines only as told by your health care provider. If you are being treated with physical therapy, follow instructions from your physical therapist. Avoid exercises that put a lot of demand on your shoulder, such as throwing. These exercises can make pain worse. Keep all follow-up visits as told by your health care provider. This is important. Contact a health care provider if: You develop new symptoms. Your symptoms get worse. Summary Adhesive capsulitis, also called frozen shoulder, causes  the shoulder to become stiff and painful to move. You are more likely to have this condition if you are a woman and over age 54. It is treated with physical therapy, medicines, and sometimes surgery. This information is not intended to replace advice given to you by  your health care provider. Make sure you discuss any questions you have with your health care provider. Document Revised: 05/22/2018 Document Reviewed: 05/22/2018 Elsevier Patient Education  [place].       Influenza (Flu) Vaccine (Inactivated or Recombinant): What You Need to Know 1. Why get vaccinated? Influenza vaccine can prevent influenza (flu). Flu is a contagious disease that spreads around the [place] every year, usually between October and May. Anyone can get the flu, but it is more dangerous for some people. Infants and young children, people 64 years and older, pregnant people, and people with certain health conditions or a weakened immune system are at greatest risk of flu complications. Pneumonia, bronchitis, sinus infections, and ear infections are examples of flu-related complications. If you have a medical condition, such as heart disease, cancer, or diabetes, flu can make it worse. Flu can cause fever and chills, sore throat, muscle aches, fatigue, cough, headache, and runny or stuffy nose. Some people may have vomiting and diarrhea, though this is more common in children than adults. In an average year, thousands of people in the [place] die from flu, and many more are hospitalized. Flu vaccine prevents millions of illnesses and flu-related visits to the doctor each year. 2. Influenza vaccines CDC recommends everyone 6 months and older get vaccinated every flu season. Children 6 months through 24 years of age may need 2 doses during a single flu season. Everyone else needs only 1 dose each flu season. It takes about 2 weeks for protection to develop after vaccination. There are many flu viruses, and they are always changing. Each year a new flu vaccine is made to protect against the influenza viruses believed to be likely to cause disease in the upcoming flu season. Even when the vaccine doesn't exactly match these viruses, it may still provide some  protection. Influenza vaccine does not cause flu. Influenza vaccine may be given at the same time as other vaccines. 3. Talk with your health care provider Tell your vaccination provider if the person getting the vaccine: Has had an allergic reaction after a previous dose of influenza vaccine, or has any severe, life-threatening allergies Has ever had Guillain-Barr Syndrome (also called "GBS") In some cases, your health care provider may decide to postpone influenza vaccination until a future visit. Influenza vaccine can be administered at any time during pregnancy. People who are or will be pregnant during influenza season should receive inactivated influenza vaccine. People with minor illnesses, such as a cold, may be vaccinated. People who are moderately or severely ill should usually wait until they recover before getting influenza vaccine. Your health care provider can give you more information. 4. Risks of a vaccine reaction Soreness, redness, and swelling where the shot is given, fever, muscle aches, and headache can happen after influenza vaccination. There may be a very small increased risk of Guillain-Barr Syndrome (GBS) after inactivated influenza vaccine (the flu shot). Young children who get the flu shot along with pneumococcal vaccine (PCV13) and/or DTaP vaccine at the same time might be slightly more likely to have a seizure caused by fever. Tell your health care provider if a child who is getting flu vaccine has ever had a seizure.  People sometimes faint after medical procedures, including vaccination. Tell your provider if you feel dizzy or have vision changes or ringing in the ears. As with any medicine, there is a very remote chance of a vaccine causing a severe allergic reaction, other serious injury, or death. 5. What if there is a serious problem? An allergic reaction could occur after the vaccinated person leaves the clinic. If you see signs of a severe allergic reaction  (hives, swelling of the face and throat, difficulty breathing, a fast heartbeat, dizziness, or weakness), call 9-1-1 and get the person to the nearest hospital. For other signs that concern you, call your health care provider. Adverse reactions should be reported to the Vaccine Adverse Event Reporting System (VAERS). Your health care provider will usually file this report, or you can do it yourself. Visit the VAERS website at www.vaers.SamedayNews.es or call 626-817-1405. VAERS is only for reporting reactions, and VAERS staff members do not give medical advice. 6. The National Vaccine Injury Compensation Program The [employer] Vaccine Injury Compensation Program (VICP) is a federal program that was created to compensate people who may have been injured by certain vaccines. Claims regarding alleged injury or death due to vaccination have a time limit for filing, which may be as short as two years. Visit the VICP website at GoldCloset.com.ee or call 845 091 3745 to learn about the program and about filing a claim. 7. How can I learn more? Ask your health care provider. Call your local or state health department. Visit the website of the Food and Drug Administration (FDA) for vaccine package inserts and additional information at TraderRating.uy. Contact the Centers for Disease Control and Prevention (CDC): Call 513-449-6336 (1-800-CDC-INFO) or Visit CDC's website at https://gibson.com/. Vaccine Information Statement Inactivated Influenza Vaccine (08/04/2020) This information is not intended to replace advice given to you by your health care provider. Make sure you discuss any questions you have with your health care provider. Document Revised: 09/21/2020 Document Reviewed: 09/21/2020 Elsevier Patient Education  2022 [employer].

## 2021-09-04 NOTE — Progress Notes (Signed)
Kathleen Slape T. Haedyn Ancrum, MD, [place] [facility] [place], [address]  Phone: [phone]  FAX: [place] - 60 y.o. female  MRN [id number]  Date of Birth: June 03, 1961  Date: 09/05/2021  PCP: Pleas [name], NP  Referral: Pleas [name], NP  Chief Complaint  Patient presents with   Shoulder Pain    Left     This visit occurred during the SARS-CoV-2 public health emergency.  Safety protocols were in place, including screening questions prior to the visit, additional usage of staff PPE, and extensive cleaning of exam room while observing appropriate contact time as indicated for disinfecting solutions.   Subjective:   Kathleen Ballard is a 60 y.o. very pleasant female patient with Body mass index is 35.66 kg/m. who presents with the following:  She has been having some L shoulder at least since 02/2021.  Does not have good range of motion from lymph node removal and mastectomy.  Lateral arm movememnt.  Removed from shoulder all lymph nodes.  Loss of motion is fairly dramatic.  10 years ago with mastectomy.   Does take some tylenol arthritis, then motrin eight hundred. No numbness down arm.  No shoulder radiograph, to assess glenohumeral joint.  L shoulder injection.  Review of Systems is noted in the HPI, as appropriate   Objective:   BP 120/76   Pulse 73   Temp (!) 97.5 F (36.4 C) (Temporal)   Ht 5\' 10"  (1.778 m)   Wt 248 lb 8 oz (112.7 kg)   SpO2 97%   BMI 35.66 kg/m    Shoulder: R and L Inspection: No muscle wasting or winging Ecchymosis/edema: neg  AC joint, scapula, clavicle: NT Cervical spine: NT, full ROM Spurling's: neg ABNORMAL SIDE TESTED: Left UNLESS OTHERWISE NOTED, THE CONTRALATERAL SIDE HAS FULL RANGE OF MOTION. Abduction: 5/5, LIMITED TO 105 DEGREES Flexion: 5/5, LIMITED TO 105 DEGNO ROM  IR, lift-off: 5/5. TESTED AT 90 DEGREES OF ABDUCTION, LIMITED TO  10 DEGREES ER at neutral:  5/5, TESTED AT 90 DEGREES OF ABDUCTION, LIMITED TO 45 DEGREES AC crossover and compression: PAIN Drop Test: neg Empty Can: neg Supraspinatus insertion: NT Bicipital groove: NT ALL OTHER SPECIAL TESTING EQUIVOCAL GIVEN LOSS OF MOTION C5-T1 intact Sensation intact Grip 5/5    Radiology: No results found.  Assessment and Plan:     ICD-10-CM   1. Chronic left shoulder pain  M25.512 DG Shoulder Left   G89.29 triamcinolone acetonide (KENALOG-40) injection 40 mg    2. Limited range of motion (ROM) of shoulder  M25.619 DG Shoulder Left     Chronic loss of motion.  Glenohumeral arthritis versus longstanding frozen shoulder.  Reviewed range of motion protocols, and she has been well versed in this in the past.  Intra-articular injection today.  X-ray to assess glenohumeral joint.  If no glenohumeral arthritic changes, manipulation under anesthesia versus capsular release would be reasonable.  The patient does not really want to consider surgery.  Intraarticular Shoulder Aspiration/Injection Procedure Note Kathleen Ballard 1961-01-31 Date of procedure: 09/05/2021  Procedure: Large Joint Aspiration / Injection of Shoulder, Intraarticular, L Indications: Pain  Procedure Details Verbal consent was obtained from the patient. Risks explained and contrasted with benefits and alternatives. Patient prepped with Chloraprep and Ethyl Chloride used for anesthesia. An intraarticular shoulder injection was performed using the posterior approach; needle placed into joint capsule without difficulty. The patient tolerated the procedure well and had  decreased pain post injection. No complications. Injection: 9 cc of Lidocaine 1% and 1 mL Kenalog 40 mg. Needle: 21 gauge, 2 inch   Meds ordered this encounter  Medications   triamcinolone acetonide (KENALOG-40) injection 40 mg   There are no discontinued medications. Orders Placed This Encounter  Procedures   DG Shoulder  Left    Follow-up: No follow-ups on file.  Dragon [employer] One speech-to-text software was used for transcription in this dictation.  Possible transcriptional errors can occur using Editor, commissioning.   Signed,  Maud Deed. Sloka Volante, MD   Outpatient Encounter Medications as of 09/05/2021  Medication Sig   albuterol (VENTOLIN HFA) 108 (90 Base) MCG/ACT inhaler INHALE 2 PUFFS EVERY 6 (SIX) HOURS AS NEEDED FOR WHEEZING OR SHORTNESS OF BREATH   omeprazole (PRILOSEC) 20 MG capsule Take 1 capsule by mouth once or twice daily for heartburn.   propranolol ER (INDERAL LA) 120 MG 24 hr capsule Take 1 capsule (120 mg total) by mouth daily. For headache prevention   sertraline (ZOLOFT) 50 MG tablet Take 1 tablet (50 mg total) by mouth daily. For anxiety.   SUMAtriptan (IMITREX) 25 MG tablet 1- 2 TABS AT MIGRAINE ONSET. REPEAT WITH 1-2 tablets in 2HRS IF HEADACHE PERSISTS OR RECURS NO MORE >4/24HRS   SYMBICORT 80-4.5 MCG/ACT inhaler TAKE 2 PUFFS BY MOUTH TWICE A DAY   [EXPIRED] triamcinolone acetonide (KENALOG-40) injection 40 mg    No facility-administered encounter medications on file as of 09/05/2021.

## 2021-09-05 ENCOUNTER — Encounter: Payer: Self-pay | Admitting: Family Medicine

## 2021-09-05 ENCOUNTER — Other Ambulatory Visit: Payer: Self-pay

## 2021-09-05 ENCOUNTER — Ambulatory Visit: Payer: 59 | Admitting: Family Medicine

## 2021-09-05 VITALS — BP 120/76 | HR 73 | Temp 97.5°F | Ht 70.0 in | Wt 248.5 lb

## 2021-09-05 DIAGNOSIS — G8929 Other chronic pain: Secondary | ICD-10-CM | POA: Diagnosis not present

## 2021-09-05 DIAGNOSIS — M25619 Stiffness of unspecified shoulder, not elsewhere classified: Secondary | ICD-10-CM | POA: Diagnosis not present

## 2021-09-05 DIAGNOSIS — M25512 Pain in left shoulder: Secondary | ICD-10-CM | POA: Diagnosis not present

## 2021-09-05 MED ORDER — TRIAMCINOLONE ACETONIDE 40 MG/ML IJ SUSP
40.0000 mg | Freq: Once | INTRAMUSCULAR | Status: AC
  Administered 2021-09-05: 40 mg via INTRA_ARTICULAR

## 2021-09-15 ENCOUNTER — Other Ambulatory Visit: Payer: Self-pay | Admitting: Primary Care

## 2021-09-15 DIAGNOSIS — K219 Gastro-esophageal reflux disease without esophagitis: Secondary | ICD-10-CM

## 2021-10-11 ENCOUNTER — Ambulatory Visit: Payer: 59 | Admitting: Hematology and Oncology

## 2021-10-13 NOTE — Progress Notes (Signed)
Patient Care Team: Pleas [name], NP as PCP - General (Internal Medicine)  DIAGNOSIS:    ICD-10-CM   1. Acute pain of left shoulder  M25.512 DG Shoulder Left    2. 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, estrogen receptor positive ([place])  C50.811    C50.812    Z17.0       SUMMARY OF ONCOLOGIC HISTORY: 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[place])  09/2011 Initial Diagnosis   Palpable left breast mass status post bilateral mastectomies: Left: 5 cm IDC T3N3 (supraclavicular lymph node involvement) ER 0%, PR 20%, HER-2 positive ratio 2.9 there was extension into anterior and posterior margin, 3/5 lymph nodes positive; right: Incidentally discovered 0.8 cm grade 1 IDC with DCIS no prognostic panel done, 0/9 lymph nodes negative   11/27/2011 - 04/03/2012 Chemotherapy   Dose dense Adriamycin and Cytoxan followed by Taxol Herceptin, Herceptin maintenance completed 02/05/2013   03/2012 - 04/2012 Radiation Therapy   Adjuvant XRT   06/21/2012 - 06/21/2014 Anti-estrogen oral therapy   Could not tolerate multiple antiestrogen therapy options.     CHIEF COMPLIANT: Follow-up of bilateral breast cancer  INTERVAL HISTORY: Kathleen Ballard is a 60 y.o. with above-mentioned history of bilateral breast cancer treated with bilateral mastectomies, adjuvant chemotherapy, radiation, and who could not tolerate anti-estrogen therapy. She presents to the clinic today for follow-up.  Her major complaint today is left shoulder stiffness and pain.  She has seen orthopedics and they try to get an x-ray.  I can order the x-ray today.  Apart from that she does feel slightly fatigued for the past 2 weeks.  No particular reason.  ALLERGIES:  is allergic to erythromycin and penicillins.  MEDICATIONS:  Current Outpatient Medications  Medication Sig Dispense Refill   albuterol (VENTOLIN HFA) 108 (90 Base) MCG/ACT inhaler INHALE 2 PUFFS EVERY 6 (SIX) HOURS AS  NEEDED FOR WHEEZING OR SHORTNESS OF BREATH 18 g 0   omeprazole (PRILOSEC) 20 MG capsule TAKE 1 CAPSULE BY MOUTH ONCE OR TWICE DAILY FOR HEARTBURN. 180 capsule 1   propranolol ER (INDERAL LA) 120 MG 24 hr capsule Take 1 capsule (120 mg total) by mouth daily. For headache prevention 90 capsule 3   sertraline (ZOLOFT) 50 MG tablet Take 1 tablet (50 mg total) by mouth daily. For anxiety. 90 tablet 3   SUMAtriptan (IMITREX) 25 MG tablet 1- 2 TABS AT MIGRAINE ONSET. REPEAT WITH 1-2 tablets in 2HRS IF HEADACHE PERSISTS OR RECURS NO MORE >4/24HRS 10 tablet 0   SYMBICORT 80-4.5 MCG/ACT inhaler TAKE 2 PUFFS BY MOUTH TWICE A DAY 10.2 each 5   No current facility-administered medications for this visit.    PHYSICAL EXAMINATION: ECOG PERFORMANCE STATUS: 1 - Symptomatic but completely ambulatory  Vitals:   10/15/21 0821  BP: (!) 148/97  Pulse: (!) 119  Resp: 17  Temp: (!) 97.2 F (36.2 C)  SpO2: 98%   Filed Weights   10/15/21 0821  Weight: 239 lb 9.6 oz (108.7 kg)    BREAST: Bilateral mastectomies without any palpable lumps or nodules (exam performed in the presence of a chaperone)  LABORATORY DATA:  I have reviewed the data as listed CMP Latest Ref Rng & Units 07/06/2020 09/27/2019 01/14/2018  Glucose 70 - 99 mg/dL 123(H) - 109(H)  BUN 6 - 23 mg/dL 18 - 14  Creatinine 0.40 - 1.20 mg/dL 0.75 0.70 0.75  Sodium 135 - 145 mEq/L 140 - 141  Potassium 3.5 - 5.1  mEq/L 4.1 - 4.0  Chloride 96 - 112 mEq/L 105 - 104  CO2 19 - 32 mEq/L 30 - 29  Calcium 8.4 - 10.5 mg/dL 9.2 - 9.1  Total Protein 6.0 - 8.3 g/dL 6.9 - -  Total Bilirubin 0.2 - 1.2 mg/dL 0.4 - -  Alkaline Phos 39 - 117 U/L 117 - -  AST 0 - 37 U/L 12 - -  ALT 0 - 35 U/L 13 - -    Lab Results  Component Value Date   WBC 6.2 07/06/2020   HGB 13.4 07/06/2020   HCT 40.1 07/06/2020   MCV 86.8 07/06/2020   PLT 212.0 07/06/2020   NEUTROABS 4.0 07/06/2020    ASSESSMENT & PLAN:  Overlapping malignant neoplasm of female breast  [facility]) October 2012: Bilateral breast cancers (diagnosed and treated at [facility]) moved to [place] due to her job. Left: T3 N3 M0 stage III left IDC with questionable positive margins, 3/5 lymph nodes positive along with supraclavicular lymph node.  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  Did not restart it because her ER was 0% and PR was 20% only. -------------------------------------------------------------------------------------------------------------------------------- Breast cancer surveillance: 1.  Breast exam  10/15/2021  bilateral mastectomies no palpable lumps or nodules of concern 2.  No role for imaging since she had bilateral mastectomies.   Left shoulder stiffness and pain: I will get an x-ray of her left shoulder today. I will call her with the results of the x-ray. She is complaining of fatigue for the last several weeks.  No particular reason.  She has a primary care appointment first week of November.  I discussed with her that she could be followed by long-term survivorship annually.    Orders Placed This Encounter  Procedures   DG Shoulder Left    Standing Status:   Future    Standing Expiration Date:   10/15/2022    Order Specific Question:   Reason for Exam (SYMPTOM  OR DIAGNOSIS REQUIRED)    Answer:   Left shoulder pain with dec range of motion with H/O breast cancer    Order Specific Question:   Is patient pregnant?    Answer:   No    Order Specific Question:   Preferred imaging location?    Answer:   [facility]    Order Specific Question:   Release to patient    Answer:   Immediate    The patient has a good understanding of the overall plan. she agrees with it. she will call with any problems that may develop before the next  visit here.  Total time spent: 20 mins including face to face time and time spent for planning, charting and coordination of care  Rulon Eisenmenger, MD, MPH 10/15/2021  I, Thana Ates, am acting as scribe for Dr. Nicholas Lose.  I have reviewed the above documentation for accuracy and completeness, and I agree with the above.

## 2021-10-15 ENCOUNTER — Other Ambulatory Visit: Payer: Self-pay

## 2021-10-15 ENCOUNTER — Ambulatory Visit (HOSPITAL_COMMUNITY)
Admission: RE | Admit: 2021-10-15 | Discharge: 2021-10-15 | Disposition: A | Discharge status: Home / Self Care | Payer: 59 | Source: Ambulatory Visit | Attending: Hematology and Oncology | Admitting: Hematology and Oncology

## 2021-10-15 ENCOUNTER — Inpatient Hospital Stay: Payer: 59 | Attending: Hematology and Oncology | Admitting: Hematology and Oncology

## 2021-10-15 VITALS — BP 148/97 | HR 119 | Temp 97.2°F | Resp 17 | Wt 239.6 lb

## 2021-10-15 DIAGNOSIS — C50812 Malignant neoplasm of overlapping sites of left female breast: Secondary | ICD-10-CM | POA: Diagnosis not present

## 2021-10-15 DIAGNOSIS — M25512 Pain in left shoulder: Secondary | ICD-10-CM | POA: Diagnosis present

## 2021-10-15 DIAGNOSIS — Z170 Estrogen receptor positive status [ER+]: Secondary | ICD-10-CM | POA: Diagnosis not present

## 2021-10-15 DIAGNOSIS — C50811 Malignant neoplasm of overlapping sites of right female breast: Secondary | ICD-10-CM

## 2021-10-15 NOTE — Assessment & Plan Note (Signed)
October 2012: Bilateral breast cancers(diagnosed and treated at [facility]) moved to [place] due to her job. Left: T3 N3 M0 stage III left IDC with questionable positive margins, 3/5 lymph nodes positive along with supraclavicular lymph node. 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  Did not restart it because her ER was 0% and PR was 20% only. -------------------------------------------------------------------------------------------------------------------------------- Breast cancer surveillance: 1.Breast exam  10/15/2021  bilateral mastectomies no palpable lumps or nodules of concern 2.No role for imaging since she had bilateral mastectomies.  I discussed the importance of exercise of 30 minutes every day. I discussed with her that she could be followed by long-term survivorship annually.

## 2021-10-24 ENCOUNTER — Ambulatory Visit: Payer: 59 | Admitting: Family Medicine

## 2021-10-24 ENCOUNTER — Other Ambulatory Visit: Payer: Self-pay

## 2021-10-24 ENCOUNTER — Encounter: Payer: Self-pay | Admitting: Family Medicine

## 2021-10-24 VITALS — BP 96/70 | HR 52 | Temp 97.1°F | Ht 70.0 in | Wt 241.0 lb

## 2021-10-24 DIAGNOSIS — M25512 Pain in left shoulder: Secondary | ICD-10-CM | POA: Diagnosis not present

## 2021-10-24 DIAGNOSIS — M7502 Adhesive capsulitis of left shoulder: Secondary | ICD-10-CM | POA: Diagnosis not present

## 2021-10-24 DIAGNOSIS — G8929 Other chronic pain: Secondary | ICD-10-CM | POA: Diagnosis not present

## 2021-10-24 MED ORDER — DICLOFENAC SODIUM 75 MG PO TBEC: 75.0000 mg | DELAYED_RELEASE_TABLET | Freq: Two times a day (BID) | ORAL | 3 refills | Status: AC

## 2021-10-24 MED ORDER — TRAMADOL HCL 50 MG PO TABS: 50.0000 mg | ORAL_TABLET | Freq: Three times a day (TID) | ORAL | 0 refills | Status: AC | PRN

## 2021-10-24 NOTE — Progress Notes (Signed)
Kathleen Branscomb T. Kathleen Gottwald, MD, [place] [facility] [place], [address]  Phone: [phone]  FAX: [phone]  Kathleen Ballard - 60 y.o. female  MRN [id number]  Date of Birth: [birth year]  Date: 10/24/2021  PCP: Pleas [name], NP  Referral: Pleas [name], NP  Chief Complaint  Patient presents with  . Shoulder Pain    Left    This visit occurred during the SARS-CoV-2 public health emergency.  Safety protocols were in place, including screening questions prior to the visit, additional usage of staff PPE, and extensive cleaning of exam room while observing appropriate contact time as indicated for disinfecting solutions.   Subjective:   Kathleen Ballard is a 60 y.o. very pleasant female patient with Body mass index is 34.58 kg/m. who presents with the following:  Recently saw Onc, and they did a shoulder x-ray that I reviewed today in the office. '  We look at these face-to-face.  The patient has essentially no significant glenohumeral arthritis.  Multi-year history of L shoulder pain.  Notable loss of motion for years.   Status post breast cancer surgeries with lymph node removal. Has a lot of shoulder scarring.  2 surgeries to remove scar tissue. Dr. Evorn Gong with [facility].  It has been.   Originally took about 9, and then 5 the next time.   She continues to have pain with terminal motion, and she has pain at nighttime as well.  Did do a intra-articular shoulder injection last time I saw her, this did really did not help much beyond a few days.  Review of Systems is noted in the HPI, as appropriate   Objective:   BP 96/70   Pulse (!) 52   Temp (!) 97.1 F (36.2 C) (Temporal)   Ht 5\' 10"  (1.778 m)   Wt 241 lb (109.3 kg)   SpO2 99%   BMI 34.58 kg/m    Shoulder: R and L Inspection: No muscle wasting or winging Ecchymosis/edema: neg  AC joint, scapula, clavicle: NT Cervical spine: NT, full  ROM Spurling's: neg ABNORMAL SIDE TESTED: Left UNLESS OTHERWISE NOTED, THE CONTRALATERAL SIDE HAS FULL RANGE OF MOTION. Abduction: 5/5, LIMITED TO 115 DEGREES Flexion: 5/5, LIMITED TO 140 DEGNO ROM  IR, lift-off: 5/5. TESTED AT 90 DEGREES OF ABDUCTION, LIMITED TO 5 DEGREES ER at neutral:  5/5, TESTED AT 90 DEGREES OF ABDUCTION, LIMITED TO 10 DEGREES AC crossover and compression: PAIN Drop Test: neg Empty Can: neg Supraspinatus insertion: NT Bicipital groove: NT ALL OTHER SPECIAL TESTING EQUIVOCAL GIVEN LOSS OF MOTION C5-T1 intact Sensation intact Grip 5/5    Radiology: DG Shoulder Left  Result Date: 10/16/2021 CLINICAL DATA:  Pain EXAM: LEFT SHOULDER - 3 VIEW COMPARISON:  None. FINDINGS: There is no evidence of fracture or dislocation. There is no evidence of arthropathy or other focal bone abnormality. Surgical clips noted in the axilla and supraclavicular region. Soft tissues otherwise unremarkable. IMPRESSION: No acute osseous abnormality or significant degenerative changes. Electronically Signed   By: Yetta Glassman M.D.   On: 10/16/2021 09:16    Assessment and Plan:     ICD-10-CM   1. Adhesive capsulitis of left shoulder  M75.02 Ambulatory referral to Physical Therapy    2. Chronic left shoulder pain  M25.512 Ambulatory referral to Physical Therapy   G89.29      Clinically consistent with frozen shoulder without any glenohumeral arthritis.  My suspicion that some scarring from her multiple  oncology surgeries is playing a role.  Fairly typical for secondary frozen shoulder.  I think here that doing anything at all without operative intervention would be in the patient's best interest.  I Kathleen Ballard have her do some formal physical therapy for frozen shoulder, and start some Voltaren.  I also think that using some tramadol prior to formal therapy and home therapy would be a very good idea.  Acute on chronic shoulder pain with exacerbation  Meds ordered this encounter   Medications  . diclofenac (VOLTAREN) 75 MG EC tablet    Sig: Take 1 tablet (75 mg total) by mouth 2 (two) times daily.    Dispense:  60 tablet    Refill:  3  . traMADol (ULTRAM) 50 MG tablet    Sig: Take 1 tablet (50 mg total) by mouth every 8 (eight) hours as needed for moderate pain.    Dispense:  20 tablet    Refill:  0   There are no discontinued medications. Orders Placed This Encounter  Procedures  . Ambulatory referral to Physical Therapy    Follow-up: Return in about 8 weeks (around 12/19/2021).  Dragon [employer] One speech-to-text software was used for transcription in this dictation.  Possible transcriptional errors can occur using Editor, commissioning.   Signed,  Kathleen Deed. Kaci Freel, MD   Outpatient Encounter Medications as of 10/24/2021  Medication Sig  . albuterol (VENTOLIN HFA) 108 (90 Base) MCG/ACT inhaler INHALE 2 PUFFS EVERY 6 (SIX) HOURS AS NEEDED FOR WHEEZING OR SHORTNESS OF BREATH  . diclofenac (VOLTAREN) 75 MG EC tablet Take 1 tablet (75 mg total) by mouth 2 (two) times daily.  Marland Kitchen omeprazole (PRILOSEC) 20 MG capsule TAKE 1 CAPSULE BY MOUTH ONCE OR TWICE DAILY FOR HEARTBURN.  Marland Kitchen propranolol ER (INDERAL LA) 120 MG 24 hr capsule Take 1 capsule (120 mg total) by mouth daily. For headache prevention  . sertraline (ZOLOFT) 50 MG tablet Take 1 tablet (50 mg total) by mouth daily. For anxiety.  . SUMAtriptan (IMITREX) 25 MG tablet 1- 2 TABS AT MIGRAINE ONSET. REPEAT WITH 1-2 tablets in 2HRS IF HEADACHE PERSISTS OR RECURS NO MORE >4/24HRS  . SYMBICORT 80-4.5 MCG/ACT inhaler TAKE 2 PUFFS BY MOUTH TWICE A DAY  . traMADol (ULTRAM) 50 MG tablet Take 1 tablet (50 mg total) by mouth every 8 (eight) hours as needed for moderate pain.   No facility-administered encounter medications on file as of 10/24/2021.

## 2021-10-30 ENCOUNTER — Encounter: Payer: Self-pay | Admitting: Primary Care

## 2021-10-30 ENCOUNTER — Other Ambulatory Visit: Payer: Self-pay

## 2021-10-30 ENCOUNTER — Ambulatory Visit (INDEPENDENT_AMBULATORY_CARE_PROVIDER_SITE_OTHER): Payer: 59 | Admitting: Primary Care

## 2021-10-30 VITALS — BP 128/78 | HR 72 | Temp 97.5°F | Ht 69.0 in | Wt 238.2 lb

## 2021-10-30 DIAGNOSIS — K219 Gastro-esophageal reflux disease without esophagitis: Secondary | ICD-10-CM | POA: Diagnosis not present

## 2021-10-30 DIAGNOSIS — F419 Anxiety disorder, unspecified: Secondary | ICD-10-CM

## 2021-10-30 DIAGNOSIS — M25512 Pain in left shoulder: Secondary | ICD-10-CM | POA: Diagnosis not present

## 2021-10-30 DIAGNOSIS — Z0001 Encounter for general adult medical examination with abnormal findings: Secondary | ICD-10-CM | POA: Diagnosis not present

## 2021-10-30 DIAGNOSIS — G43709 Chronic migraine without aura, not intractable, without status migrainosus: Secondary | ICD-10-CM

## 2021-10-30 DIAGNOSIS — R5383 Other fatigue: Secondary | ICD-10-CM | POA: Diagnosis not present

## 2021-10-30 DIAGNOSIS — Z23 Encounter for immunization: Secondary | ICD-10-CM | POA: Diagnosis not present

## 2021-10-30 DIAGNOSIS — G8929 Other chronic pain: Secondary | ICD-10-CM

## 2021-10-30 DIAGNOSIS — Z1211 Encounter for screening for malignant neoplasm of colon: Secondary | ICD-10-CM

## 2021-10-30 DIAGNOSIS — R519 Headache, unspecified: Secondary | ICD-10-CM

## 2021-10-30 DIAGNOSIS — J309 Allergic rhinitis, unspecified: Secondary | ICD-10-CM

## 2021-10-30 DIAGNOSIS — J45909 Unspecified asthma, uncomplicated: Secondary | ICD-10-CM

## 2021-10-30 LAB — COMPREHENSIVE METABOLIC PANEL
ALT: 20 U/L (ref 0–35)
AST: 14 U/L (ref 0–37)
Albumin: 4.4 g/dL (ref 3.5–5.2)
Alkaline Phosphatase: 82 U/L (ref 39–117)
BUN: 19 mg/dL (ref 6–23)
CO2: 29 mEq/L (ref 19–32)
Calcium: 9.5 mg/dL (ref 8.4–10.5)
Chloride: 106 mEq/L (ref 96–112)
Creatinine, Ser: 0.8 mg/dL (ref 0.40–1.20)
GFR: 80.53 mL/min (ref >60.00)
Glucose, Bld: 107 mg/dL — ABNORMAL HIGH (ref 70–99)
Potassium: 4.6 mEq/L (ref 3.5–5.1)
Sodium: 141 mEq/L (ref 135–145)
Total Bilirubin: 0.6 mg/dL (ref 0.2–1.2)
Total Protein: 6.6 g/dL (ref 6.0–8.3)

## 2021-10-30 LAB — CBC
HCT: 40.1 % (ref 36.0–46.0)
Hemoglobin: 13.6 g/dL (ref 12.0–15.0)
MCHC: 33.9 g/dL (ref 30.0–36.0)
MCV: 86.9 fl (ref 78.0–100.0)
Platelets: 193 10*3/uL (ref 150.0–400.0)
RBC: 4.62 Mil/uL (ref 3.87–5.11)
RDW: 14.4 % (ref 11.5–15.5)
WBC: 5 10*3/uL (ref 4.0–10.5)

## 2021-10-30 LAB — VITAMIN B12: Vitamin B-12: 241 pg/mL (ref 211–911)

## 2021-10-30 LAB — TSH: TSH: 0.91 u[IU]/mL (ref 0.35–5.50)

## 2021-10-30 LAB — LIPID PANEL
Cholesterol: 181 mg/dL (ref 0–200)
HDL: 51.2 mg/dL (ref >39.00)
LDL Cholesterol: 113 mg/dL — ABNORMAL HIGH (ref 0–99)
NonHDL: 129.5
Total CHOL/HDL Ratio: 4
Triglycerides: 83 mg/dL (ref 0.0–149.0)
VLDL: 16.6 mg/dL (ref 0.0–40.0)

## 2021-10-30 LAB — HEMOGLOBIN A1C: Hgb A1c MFr Bld: 5.4 % (ref 4.6–6.5)

## 2021-10-30 LAB — VITAMIN D 25 HYDROXY (VIT D DEFICIENCY, FRACTURES): VITD: 21.02 ng/mL — ABNORMAL LOW (ref 30.00–100.00)

## 2021-10-30 NOTE — Assessment & Plan Note (Signed)
Controlled on daily propranolol ER 120 mg, infrequent use of Imitrex 25 mg PRN.   Continue current regimen.

## 2021-10-30 NOTE — Assessment & Plan Note (Signed)
Overall controlled on daily Symbicort daily and PRN albuterol.   Continue same.

## 2021-10-30 NOTE — Assessment & Plan Note (Signed)
Stable.       - Continue to monitor

## 2021-10-30 NOTE — Progress Notes (Signed)
Subjective:    Patient ID: Kathleen Ballard, female    DOB: 07-Oct-1961, 61 y.o.   MRN: [id number]  HPI  Kathleen Ballard is a very pleasant 60 y.o. female who presents today for complete physical and follow up of chronic conditions.  She would also like to discuss fatigue. Acute for the last 4-5 weeks. She wakes during the night 2-3 times at night, will get up and use the bathroom. She denies snoring. Feels well rested. Doesn't feel that she has any energy, feels flushed at times. She did mention these symptoms to her oncologist who recommended she see her PCP. She has transitioned to the survivorship program.   Immunizations: -Tetanus: Unsure, suspects she had one 2013.  -Influenza: Completed this season  -Covid-19: 3 vaccines  -Shingles: Never completed  Diet: Fair diet.  Exercise: No regular exercise.  Eye exam: Completes annually  Dental exam: No recent visit.   Pap Smear: Hysterectomy  Mammogram: None. Bilateral mastectomy.  Colonoscopy: Completed > 10 years ago.    BP Readings from Last 3 Encounters:  10/30/21 128/78  10/24/21 96/70  10/15/21 (!) 148/97       Review of Systems  Constitutional:  Positive for fatigue. Negative for unexpected weight change.  HENT:  Negative for rhinorrhea.   Respiratory:  Negative for cough and shortness of breath.   Cardiovascular:  Negative for chest pain.  Gastrointestinal:  Negative for constipation and diarrhea.  Genitourinary:  Negative for difficulty urinating.  Musculoskeletal:  Positive for arthralgias.  Skin:  Negative for rash.  Allergic/Immunologic: Positive for environmental allergies.  Neurological:  Negative for headaches.  Psychiatric/Behavioral:  The patient is nervous/anxious.         Past Medical History:  Diagnosis Date   Allergic rhinitis 08/03/2016   Allergy    Seasonal   Asthma with exacerbation 08/02/2016   Blood transfusion without reported diagnosis    Cancer ([place])    Stage III breast cancer - in  remission   Depression    H/O bilateral mastectomy    PUD (peptic ulcer disease)    Thyroid disease    Graves' Disease    Social History   Socioeconomic History   Marital status: Married    Spouse name: Kathleen Ballard   Number of children: 2   Years of education: 18   Highest education level: Not on file  Occupational History   Occupation: Biomedical engineer  Tobacco Use   Smoking status: Never   Smokeless tobacco: Never  Substance and Sexual Activity   Alcohol use: No    Alcohol/week: 0.0 standard drinks   Drug use: No   Sexual activity: Not on file  Other Topics Concern   Not on file  Social History Narrative   Born and raised in [place], [place]. Currently resides in a private residence with her husband Kathleen Ballard) and daughter. Other daughter lives independently in [place], [place].    Pets - 4 dogs and 1 cat;    Fun: Watch movies and cross-stitch.    Denies religious beliefs that would effect healthcare.    Social Determinants of Health   Financial Resource Strain: Not on file  Food Insecurity: Not on file  Transportation Needs: Not on file  Physical Activity: Not on file  Stress: Not on file  Social Connections: Not on file  Intimate Partner Violence: Not on file    Past Surgical History:  Procedure Laterality Date   ABDOMINAL HYSTERECTOMY     Partial - both ovaries remain.  BREAST BIOPSY     CHOLECYSTECTOMY     ESOPHAGEAL DILATION      Family History  Problem Relation Age of Onset   Heart disease Mother 57       CAD/PTCA   Heart disease Father        atrrial fibrillation   Heart disease Maternal Grandmother    Cancer Maternal Grandmother        Breast cancer   COPD Maternal Grandfather     Allergies  Allergen Reactions   Erythromycin Nausea Only    Makes her have a sick feeling.   Penicillins     Was told to never take during allergy testing    Current Outpatient Medications on File Prior to Visit  Medication Sig Dispense Refill   albuterol (VENTOLIN  HFA) 108 (90 Base) MCG/ACT inhaler INHALE 2 PUFFS EVERY 6 (SIX) HOURS AS NEEDED FOR WHEEZING OR SHORTNESS OF BREATH 18 g 0   diclofenac (VOLTAREN) 75 MG EC tablet Take 1 tablet (75 mg total) by mouth 2 (two) times daily. 60 tablet 3   omeprazole (PRILOSEC) 20 MG capsule TAKE 1 CAPSULE BY MOUTH ONCE OR TWICE DAILY FOR HEARTBURN. 180 capsule 1   propranolol ER (INDERAL LA) 120 MG 24 hr capsule Take 1 capsule (120 mg total) by mouth daily. For headache prevention 90 capsule 3   sertraline (ZOLOFT) 50 MG tablet Take 1 tablet (50 mg total) by mouth daily. For anxiety. 90 tablet 3   SUMAtriptan (IMITREX) 25 MG tablet 1- 2 TABS AT MIGRAINE ONSET. REPEAT WITH 1-2 tablets in 2HRS IF HEADACHE PERSISTS OR RECURS NO MORE >4/24HRS 10 tablet 0   SYMBICORT 80-4.5 MCG/ACT inhaler TAKE 2 PUFFS BY MOUTH TWICE A DAY 10.2 each 5   traMADol (ULTRAM) 50 MG tablet Take 1 tablet (50 mg total) by mouth every 8 (eight) hours as needed for moderate pain. 20 tablet 0   No current facility-administered medications on file prior to visit.    BP 128/78   Pulse 72   Temp (!) 97.5 F (36.4 C) (Temporal)   Ht 5\' 9"  (1.753 m)   Wt 238 lb 4 oz (108.1 kg)   SpO2 96%   BMI 35.18 kg/m  Objective:   Physical Exam HENT:     Right Ear: Tympanic membrane and ear canal normal.     Left Ear: Tympanic membrane and ear canal normal.     Nose: Nose normal.  Eyes:     Conjunctiva/sclera: Conjunctivae normal.     Pupils: Pupils are equal, round, and reactive to light.  Neck:     Thyroid: No thyromegaly.  Cardiovascular:     Rate and Rhythm: Normal rate and regular rhythm.     Heart sounds: No murmur heard. Pulmonary:     Effort: Pulmonary effort is normal.     Breath sounds: Normal breath sounds. No rales.  Abdominal:     General: Bowel sounds are normal.     Palpations: Abdomen is soft.     Tenderness: There is no abdominal tenderness.  Musculoskeletal:     Left shoulder: Decreased range of motion. Normal strength.      Cervical back: Neck supple.  Lymphadenopathy:     Cervical: No cervical adenopathy.  Skin:    General: Skin is warm and dry.     Findings: No rash.  Neurological:     Mental Status: She is alert and oriented to person, place, and time.     Cranial Nerves: No cranial nerve  deficit.     Deep Tendon Reflexes: Reflexes are normal and symmetric.          Assessment & Plan:      This visit occurred during the SARS-CoV-2 public health emergency.  Safety protocols were in place, including screening questions prior to the visit, additional usage of staff PPE, and extensive cleaning of exam room while observing appropriate contact time as indicated for disinfecting solutions.

## 2021-10-30 NOTE — Assessment & Plan Note (Addendum)
Stable and controlled with daily propranbolol ER 120 mg. Infrequent use of Imitrex 25 mg. Continue same.

## 2021-10-30 NOTE — Assessment & Plan Note (Signed)
Tetanus due next year? This is per patient. First Shingrix provided today.  Does not complete mammograms given double mastectomy.   Colonoscopy due, referral placed to GI.  Discussed the importance of a healthy diet and regular exercise in order for weight loss, and to reduce the risk of further co-morbidity.  Exam today as noted. Labs pending.

## 2021-10-30 NOTE — Assessment & Plan Note (Signed)
Doing well on omeprazole 20 mg daily, will occasionally take 40 mg. Continue same.

## 2021-10-30 NOTE — Assessment & Plan Note (Signed)
Acute, unclear cause at this point.  Checking labs.  Discussed her disturbed sleep pattern, she denies that she has sleep apnea.  Consider anxiety.  Await results.

## 2021-10-30 NOTE — Addendum Note (Signed)
Addended by: Ophelia Shoulder on: 10/30/2021 08:54 AM   Modules accepted: Orders

## 2021-10-30 NOTE — Patient Instructions (Signed)
Stop by the lab prior to leaving today. I will notify you of your results once received.   You will be contacted regarding your referral to GI for the colonoscopy.  Please let us know if you have not been contacted within two weeks.   Schedule a nurse visit to return in 2-6 months for your second Shingles vaccine.  It was a pleasure to see you today!  Preventive Care 30-59 Years Old, Female Preventive care refers to lifestyle choices and visits with your health care provider that can promote health and wellness. This includes: A yearly physical exam. This is also called an annual wellness visit. Regular dental and eye exams. Immunizations. Screening for certain conditions. Healthy lifestyle choices, such as: Eating a healthy diet. Getting regular exercise. Not using drugs or products that contain nicotine and tobacco. Limiting alcohol use. What can I expect for my preventive care visit? Physical exam Your health care provider will check your: Height and weight. These may be used to calculate your BMI (body mass index). BMI is a measurement that tells if you are at a healthy weight. Heart rate and blood pressure. Body temperature. Skin for abnormal spots. Counseling Your health care provider may ask you questions about your: Past medical problems. Family's medical history. Alcohol, tobacco, and drug use. Emotional well-being. Home life and relationship well-being. Sexual activity. Diet, exercise, and sleep habits. Work and work Statistician. Access to firearms. Method of birth control. Menstrual cycle. Pregnancy history. What immunizations do I need? Vaccines are usually given at various ages, according to a schedule. Your health care provider will recommend vaccines for you based on your age, medical history, and lifestyle or other factors, such as travel or where you work. What tests do I need? Blood tests Lipid and cholesterol levels. These may be checked every 5 years,  or more often if you are over 15 years old. Hepatitis C test. Hepatitis B test. Screening Lung cancer screening. You may have this screening every year starting at age 35 if you have a 30-pack-year history of smoking and currently smoke or have quit within the past 15 years. Colorectal cancer screening. All adults should have this screening starting at age 66 and continuing until age 43. Your health care provider may recommend screening at age 46 if you are at increased risk. You will have tests every 1-10 years, depending on your results and the type of screening test. Diabetes screening. This is done by checking your blood sugar (glucose) after you have not eaten for a while (fasting). You may have this done every 1-3 years. Mammogram. This may be done every 1-2 years. Talk with your health care provider about when you should start having regular mammograms. This may depend on whether you have a family history of breast cancer. BRCA-related cancer screening. This may be done if you have a family history of breast, ovarian, tubal, or peritoneal cancers. Pelvic exam and Pap test. This may be done every 3 years starting at age 78. Starting at age 31, this may be done every 5 years if you have a Pap test in combination with an HPV test. Other tests STD (sexually transmitted disease) testing, if you are at risk. Bone density scan. This is done to screen for osteoporosis. You may have this scan if you are at high risk for osteoporosis. Talk with your health care provider about your test results, treatment options, and if necessary, the need for more tests. Follow these instructions at home: Eating and drinking  Eat a diet that includes fresh fruits and vegetables, whole grains, lean protein, and low-fat dairy products. Take vitamin and mineral supplements as recommended by your health care provider. Do not drink alcohol if: Your health care provider tells you not to drink. You are pregnant,  may be pregnant, or are planning to become pregnant. If you drink alcohol: Limit how much you have to 0-1 drink a day. Be aware of how much alcohol is in your drink. In the [place] one drink equals one 12 oz bottle of beer (355 mL), one 5 oz glass of wine (148 mL), or one 1 oz glass of hard liquor (44 mL). Lifestyle Take daily care of your teeth and gums. Brush your teeth every morning and night with fluoride toothpaste. Floss one time each day. Stay active. Exercise for at least 30 minutes 5 or more days each week. Do not use any products that contain nicotine or tobacco, such as cigarettes, e-cigarettes, and chewing tobacco. If you need help quitting, ask your health care provider. Do not use drugs. If you are sexually active, practice safe sex. Use a condom or other form of protection to prevent STIs (sexually transmitted infections). If you do not wish to become pregnant, use a form of birth control. If you plan to become pregnant, see your health care provider for a prepregnancy visit. If told by your health care provider, take low-dose aspirin daily starting at age 53. Find healthy ways to cope with stress, such as: Meditation, yoga, or listening to music. Journaling. Talking to a trusted person. Spending time with friends and family. Safety Always wear your seat belt while driving or riding in a vehicle. Do not drive: If you have been drinking alcohol. Do not ride with someone who has been drinking. When you are tired or distracted. While texting. Wear a helmet and other protective equipment during sports activities. If you have firearms in your house, make sure you follow all gun safety procedures. What's next? Visit your health care provider once a year for an annual wellness visit. Ask your health care provider how often you should have your eyes and teeth checked. Stay up to date on all vaccines. This information is not intended to replace advice given to you by your health  care provider. Make sure you discuss any questions you have with your health care provider. Document Revised: 02/23/2021 Document Reviewed: 08/27/2018 Elsevier Patient Education  2022 [employer].

## 2021-10-30 NOTE — Assessment & Plan Note (Signed)
Appears stable overall.  Continue Zoloft 50 mg daily.

## 2021-10-30 NOTE — Assessment & Plan Note (Signed)
Following with sports medicine, will be starting PT this week.  Continue diclofenac 75 mg and Tramadol 50 mg PRN.

## 2021-12-04 NOTE — Telephone Encounter (Signed)
Noted and agree. Will evaluate.

## 2021-12-04 NOTE — Telephone Encounter (Signed)
Have scheduled virtual tomorrow. Will call if any changes.

## 2021-12-05 ENCOUNTER — Encounter: Payer: Self-pay | Admitting: Primary Care

## 2021-12-05 ENCOUNTER — Other Ambulatory Visit: Payer: Self-pay

## 2021-12-05 ENCOUNTER — Telehealth: Payer: 59 | Admitting: Primary Care

## 2021-12-05 DIAGNOSIS — F411 Generalized anxiety disorder: Secondary | ICD-10-CM | POA: Diagnosis not present

## 2021-12-05 MED ORDER — SERTRALINE HCL 50 MG PO TABS: 75.0000 mg | ORAL_TABLET | Freq: Every day | ORAL | 0 refills | Status: DC

## 2021-12-05 NOTE — Assessment & Plan Note (Signed)
Deteriorated.  Agree that she would benefit from therapy, referral placed.  We also discussed a dose increase of sertraline to 75 mg as it may take weeks to get in with therapy, she agrees. Rx for sertraline 75 mg sent to pharmacy.  Agree to excuse her from work for the remainder of this week.  She will update

## 2021-12-05 NOTE — Patient Instructions (Signed)
You will be contacted regarding your referral to therapy.  Please let us know if you have not been contacted within two weeks.   We increased your dose of sertraline to 75 mg. Take 1 and 1/2 tablet once daily for anxiety and depression.  It was a pleasure to see you today!

## 2021-12-05 NOTE — Progress Notes (Signed)
Patient ID: Kathleen Ballard, female    DOB: [birth year], 60 y.o.   MRN: [id number]  Virtual visit completed through [employer], a video enabled telemedicine application. Due to national recommendations of social distancing due to COVID-19, a virtual visit is felt to be most appropriate for this patient at this time. Reviewed limitations, risks, security and privacy concerns of performing a virtual visit and the availability of in person appointments. I also reviewed that there may be a patient responsible charge related to this service. The patient agreed to proceed.   Patient location: home Provider location: [place] [facility], office Persons participating in this virtual visit: patient, provider   If any vitals were documented, they were collected by patient at home unless specified below.    BP 108/84   Pulse 60   Temp (!) 96 F (35.6 C) (Temporal)   Ht 5\' 9"  (1.753 m)   Wt 238 lb (108 kg)   BMI 35.15 kg/m    CC: Anxiety/Stress Subjective:   HPI: Kathleen Ballard is a 60 y.o. female with a history of migraines, frequent headaches, anxiety, GERD, breast cancer presenting on 12/05/2021 to discuss anxiety.  Currently managed on sertraline 50 mg daily for chronic anxiety, overall has felt well managed. About two months ago symptoms progressed. She is under a lot of stress with work due to low staffing.   Symptoms include not wanting to do anything, not wanting to go anywhere, feeling fatigued, difficulty sleeping, worrying, feeling stressed, feeling anxious.   She has never meet with therapy, is interested in pursuing. She is compliant to sertraline 50 mg, overall feels that it has been effective. She is requesting to remain out of work for the remainder of this week.      Relevant past medical, surgical, family and social history reviewed and updated as indicated. Interim medical history since our last visit reviewed. Allergies and medications reviewed and  updated. Outpatient Medications Prior to Visit  Medication Sig Dispense Refill   albuterol (VENTOLIN HFA) 108 (90 Base) MCG/ACT inhaler INHALE 2 PUFFS EVERY 6 (SIX) HOURS AS NEEDED FOR WHEEZING OR SHORTNESS OF BREATH 18 g 0   diclofenac (VOLTAREN) 75 MG EC tablet Take 1 tablet (75 mg total) by mouth 2 (two) times daily. 60 tablet 3   omeprazole (PRILOSEC) 20 MG capsule TAKE 1 CAPSULE BY MOUTH ONCE OR TWICE DAILY FOR HEARTBURN. 180 capsule 1   propranolol ER (INDERAL LA) 120 MG 24 hr capsule Take 1 capsule (120 mg total) by mouth daily. For headache prevention 90 capsule 3   SUMAtriptan (IMITREX) 25 MG tablet 1- 2 TABS AT MIGRAINE ONSET. REPEAT WITH 1-2 tablets in 2HRS IF HEADACHE PERSISTS OR RECURS NO MORE >4/24HRS 10 tablet 0   SYMBICORT 80-4.5 MCG/ACT inhaler TAKE 2 PUFFS BY MOUTH TWICE A DAY 10.2 each 5   traMADol (ULTRAM) 50 MG tablet Take 1 tablet (50 mg total) by mouth every 8 (eight) hours as needed for moderate pain. 20 tablet 0   sertraline (ZOLOFT) 50 MG tablet Take 1 tablet (50 mg total) by mouth daily. For anxiety. 90 tablet 3   No facility-administered medications prior to visit.     Per HPI unless specifically indicated in ROS section below Review of Systems Objective:  BP 108/84   Pulse 60   Temp (!) 96 F (35.6 C) (Temporal)   Ht 5\' 9"  (1.753 m)   Wt 238 lb (108 kg)   BMI 35.15 kg/m   Wt Readings  from Last 3 Encounters:  12/05/21 238 lb (108 kg)  10/30/21 238 lb 4 oz (108.1 kg)  10/24/21 241 lb (109.3 kg)       Physical exam: General: Alert and oriented x 3, no distress, does not appear sickly  Pulmonary: Speaks in complete sentences without increased work of breathing, no cough during visit.  Psychiatric: Normal mood, thought content, and behavior.     Results for orders placed or performed in visit on 10/30/21  TSH  Result Value Ref Range   TSH 0.91 0.35 - 5.50 uIU/mL  CBC  Result Value Ref Range   WBC 5.0 4.0 - 10.5 K/uL   RBC 4.62 3.87 - 5.11 Mil/uL    Platelets 193.0 150.0 - 400.0 K/uL   Hemoglobin 13.6 12.0 - 15.0 g/dL   HCT 40.1 36.0 - 46.0 %   MCV 86.9 78.0 - 100.0 fl   MCHC 33.9 30.0 - 36.0 g/dL   RDW 14.4 11.5 - 15.5 %  Comprehensive metabolic panel  Result Value Ref Range   Sodium 141 135 - 145 mEq/L   Potassium 4.6 3.5 - 5.1 mEq/L   Chloride 106 96 - 112 mEq/L   CO2 29 19 - 32 mEq/L   Glucose, Bld 107 (H) 70 - 99 mg/dL   BUN 19 6 - 23 mg/dL   Creatinine, Ser 0.80 0.40 - 1.20 mg/dL   Total Bilirubin 0.6 0.2 - 1.2 mg/dL   Alkaline Phosphatase 82 39 - 117 U/L   AST 14 0 - 37 U/L   ALT 20 0 - 35 U/L   Total Protein 6.6 6.0 - 8.3 g/dL   Albumin 4.4 3.5 - 5.2 g/dL   GFR 80.53 >60.00 mL/min   Calcium 9.5 8.4 - 10.5 mg/dL  Hemoglobin A1c  Result Value Ref Range   Hgb A1c MFr Bld 5.4 4.6 - 6.5 %  Lipid panel  Result Value Ref Range   Cholesterol 181 0 - 200 mg/dL   Triglycerides 83.0 0.0 - 149.0 mg/dL   HDL 51.20 >39.00 mg/dL   VLDL 16.6 0.0 - 40.0 mg/dL   LDL Cholesterol 113 (H) 0 - 99 mg/dL   Total CHOL/HDL Ratio 4    NonHDL 129.50   Vitamin B12  Result Value Ref Range   Vitamin B-12 241 211 - 911 pg/mL  VITAMIN D 25 Hydroxy (Vit-D Deficiency, Fractures)  Result Value Ref Range   VITD 21.02 (L) 30.00 - 100.00 ng/mL   Assessment & Plan:   Problem List Items Addressed This Visit       Other   GAD (generalized anxiety disorder)    Deteriorated.  Agree that she would benefit from therapy, referral placed.  We also discussed a dose increase of sertraline to 75 mg as it may take weeks to get in with therapy, she agrees. Rx for sertraline 75 mg sent to pharmacy.  Agree to excuse her from work for the remainder of this week.  She will update      Relevant Medications   sertraline (ZOLOFT) 50 MG tablet   Other Relevant Orders   Ambulatory referral to Psychology     Meds ordered this encounter  Medications   sertraline (ZOLOFT) 50 MG tablet    Sig: Take 1.5 tablets (75 mg total) by mouth daily. For  anxiety.    Dispense:  135 tablet    Refill:  0    Order Specific Question:   Supervising Provider    Answer:   BEDSOLE, AMY E [[id number]]  Orders Placed This Encounter  Procedures   Ambulatory referral to Psychology    Referral Priority:   Routine    Referral Type:   Psychiatric    Referral Reason:   Specialty Services Required    Requested Specialty:   Psychology    Number of Visits Requested:   1    I discussed the assessment and treatment plan with the patient. The patient was provided an opportunity to ask questions and all were answered. The patient agreed with the plan and demonstrated an understanding of the instructions. The patient was advised to call back or seek an in-person evaluation if the symptoms worsen or if the condition fails to improve as anticipated.  Follow up plan:  You will be contacted regarding your referral to therapy.  Please let us know if you have not been contacted within two weeks.   We increased your dose of sertraline to 75 mg. Take 1 and 1/2 tablet once daily for anxiety and depression.  It was a pleasure to see you today!   Pleas [name], NP

## 2021-12-19 ENCOUNTER — Ambulatory Visit: Payer: 59 | Admitting: Family Medicine

## 2022-01-06 ENCOUNTER — Other Ambulatory Visit: Payer: Self-pay | Admitting: Primary Care

## 2022-01-06 DIAGNOSIS — J45909 Unspecified asthma, uncomplicated: Secondary | ICD-10-CM

## 2022-01-06 MED ORDER — ALBUTEROL SULFATE HFA 108 (90 BASE) MCG/ACT IN AERS: INHALATION_SPRAY | RESPIRATORY_TRACT | 0 refills | Status: AC

## 2022-01-29 NOTE — Telephone Encounter (Signed)
Noted. Completed and handed to [facility] who is faxing now.

## 2022-01-29 NOTE — Telephone Encounter (Signed)
Form placed in your inbox.  Need to ask TB risk questions, if negative then I can sign off and we can either fax or have her pick up.

## 2022-01-29 NOTE — Telephone Encounter (Signed)
Printed ppw and placed in your folder for review. She is due for PPD or TB gold. But has had CPE 10/30/21. Ok to call and get her to do TB labs so we can fill our ppw?

## 2022-01-29 NOTE — Telephone Encounter (Signed)
Called patient reviewed questions placed in your box for signature.

## 2022-02-05 ENCOUNTER — Other Ambulatory Visit: Payer: Self-pay

## 2022-02-05 ENCOUNTER — Ambulatory Visit: Payer: 59 | Admitting: Primary Care

## 2022-02-05 ENCOUNTER — Ambulatory Visit (INDEPENDENT_AMBULATORY_CARE_PROVIDER_SITE_OTHER)
Admission: RE | Admit: 2022-02-05 | Discharge: 2022-02-05 | Disposition: A | Discharge status: Home / Self Care | Payer: 59 | Source: Ambulatory Visit | Attending: Primary Care | Admitting: Primary Care

## 2022-02-05 VITALS — BP 110/70 | HR 75 | Temp 97.2°F | Resp 18 | Ht 69.0 in | Wt 243.8 lb

## 2022-02-05 DIAGNOSIS — R053 Chronic cough: Secondary | ICD-10-CM | POA: Insufficient documentation

## 2022-02-05 DIAGNOSIS — J45909 Unspecified asthma, uncomplicated: Secondary | ICD-10-CM | POA: Diagnosis not present

## 2022-02-05 MED ORDER — HYDROCOD POLI-CHLORPHE POLI ER 10-8 MG/5ML PO SUER: 5.0000 mL | Freq: Two times a day (BID) | ORAL | 0 refills | Status: AC | PRN

## 2022-02-05 MED ORDER — PREDNISONE 20 MG PO TABS: ORAL_TABLET | ORAL | 0 refills | Status: DC

## 2022-02-05 NOTE — Assessment & Plan Note (Signed)
Overall seems well managed on regimen of Symbicort 80-4.5 mcg, 2 puffs BID, and PRN albuterol.   Consider adding Singulair 10 mg if needed.  Will treat for acute asthma flare today. She will update me in a few days.

## 2022-02-05 NOTE — Patient Instructions (Signed)
Complete xray(s) prior to leaving today. I will notify you of your results once received.  You may take the cough suppressant every 12 hours as needed for cough and rest. Caution this medication contains codeine which may cause drowsiness.   Start prednisone 20 mg tablets. Take 2 tablets by mouth once daily for 5 days.  Please update me late this week if no improvement.  It was a pleasure to see you today!

## 2022-02-05 NOTE — Progress Notes (Signed)
Subjective:    Patient ID: Kathleen Ballard, female    DOB: [birth year], 61 y.o.   MRN: [id number]  HPI  Kathleen Ballard is a very pleasant 61 y.o. female with a history of migraines, persistent asthma, allergic rhinitis, history of breast cancer who presents today to discuss asthma.   Symptom onset about 5 weeks ago with cough, fevers, increased headaches, wheezing, shortness of breath which lasted for about 3 weeks. Five days ago her symptoms returned with wheezing, cough with coughing spells, and shortness of breath. She endorses these symptoms feel like her typical asthma flare symptoms.   Currently managed on Symbicort 80-4.5 mcg, 2 puffs BID, everyday and albuterol inhaler PRN. Overall seems well managed on this regimen, will experience flares 2-3 times annually.   Lately she's had to use her albuterol every 5 hours, temporary improvement. She's also been taking cough drops and her Zyrtec.   She denies chills, body aches, fevers.    Review of Systems  Constitutional:  Negative for chills, fatigue and fever.  Respiratory:  Positive for cough, shortness of breath and wheezing.         Past Medical History:  Diagnosis Date   Allergic rhinitis 08/03/2016   Allergy    Seasonal   Asthma with exacerbation 08/02/2016   Blood transfusion without reported diagnosis    Cancer ([place])    Stage III breast cancer - in remission   Depression    H/O bilateral mastectomy    PUD (peptic ulcer disease)    Suspected COVID-19 virus infection 01/27/2020   Thyroid disease    Graves' Disease    Social History   Socioeconomic History   Marital status: Married    Spouse name: [name]   Number of children: 2   Years of education: 18   Highest education level: Not on file  Occupational History   Occupation: Biomedical engineer  Tobacco Use   Smoking status: Never   Smokeless tobacco: Never  Substance and Sexual Activity   Alcohol use: No    Alcohol/week: 0.0 standard drinks   Drug use: No    Sexual activity: Not on file  Other Topics Concern   Not on file  Social History Narrative   Born and raised in [place], [place]. Currently resides in a private residence with her husband [name]) and daughter. Other daughter lives independently in [place], [place].    Pets - 4 dogs and 1 cat;    Fun: Watch movies and cross-stitch.    Denies religious beliefs that would effect healthcare.    Social Determinants of Health   Financial Resource Strain: Not on file  Food Insecurity: Not on file  Transportation Needs: Not on file  Physical Activity: Not on file  Stress: Not on file  Social Connections: Not on file  Intimate Partner Violence: Not on file    Past Surgical History:  Procedure Laterality Date   ABDOMINAL HYSTERECTOMY     Partial - both ovaries remain.   BREAST BIOPSY     CHOLECYSTECTOMY     ESOPHAGEAL DILATION      Family History  Problem Relation Age of Onset   Heart disease Mother 62       CAD/PTCA   Heart disease Father        atrrial fibrillation   Heart disease Maternal Grandmother    Cancer Maternal Grandmother        Breast cancer   COPD Maternal Grandfather     Allergies  Allergen Reactions  Erythromycin Nausea Only    Makes her have a sick feeling.   Penicillins     Was told to never take during allergy testing    Current Outpatient Medications on File Prior to Visit  Medication Sig Dispense Refill   albuterol (VENTOLIN HFA) 108 (90 Base) MCG/ACT inhaler INHALE 2 PUFFS EVERY 6 (SIX) HOURS AS NEEDED FOR WHEEZING OR SHORTNESS OF BREATH 18 g 0   diclofenac (VOLTAREN) 75 MG EC tablet Take 1 tablet (75 mg total) by mouth 2 (two) times daily. 60 tablet 3   omeprazole (PRILOSEC) 20 MG capsule TAKE 1 CAPSULE BY MOUTH ONCE OR TWICE DAILY FOR HEARTBURN. 180 capsule 1   propranolol ER (INDERAL LA) 120 MG 24 hr capsule Take 1 capsule (120 mg total) by mouth daily. For headache prevention 90 capsule 3   sertraline (ZOLOFT) 50 MG tablet Take 1.5 tablets (75 mg  total) by mouth daily. For anxiety. 135 tablet 0   SUMAtriptan (IMITREX) 25 MG tablet 1- 2 TABS AT MIGRAINE ONSET. REPEAT WITH 1-2 tablets in 2HRS IF HEADACHE PERSISTS OR RECURS NO MORE >4/24HRS 10 tablet 0   SYMBICORT 80-4.5 MCG/ACT inhaler TAKE 2 PUFFS BY MOUTH TWICE A DAY 10.2 each 5   traMADol (ULTRAM) 50 MG tablet Take 1 tablet (50 mg total) by mouth every 8 (eight) hours as needed for moderate pain. 20 tablet 0   No current facility-administered medications on file prior to visit.    BP 110/70 (BP Location: Right Arm, Patient Position: Sitting, Cuff Size: Large)    Pulse 75    Temp (!) 97.2 F (36.2 C) (Tympanic)    Resp 18    Ht 5\' 9"  (1.753 m)    Wt 243 lb 12.8 oz (110.6 kg)    SpO2 95%    BMI 36.00 kg/m  Objective:   Physical Exam Constitutional:      General: She is not in acute distress.    Appearance: She is not ill-appearing.  HENT:     Right Ear: Tympanic membrane and ear canal normal.     Left Ear: Tympanic membrane and ear canal normal.     Nose:     Right Sinus: No maxillary sinus tenderness or frontal sinus tenderness.     Left Sinus: No maxillary sinus tenderness or frontal sinus tenderness.     Mouth/Throat:     Pharynx: No posterior oropharyngeal erythema.  Eyes:     Conjunctiva/sclera: Conjunctivae normal.  Cardiovascular:     Rate and Rhythm: Normal rate and regular rhythm.  Pulmonary:     Effort: Pulmonary effort is normal. No respiratory distress.     Breath sounds: Rhonchi present. No wheezing.     Comments: Wet cough noted several times throughout visit Musculoskeletal:     Cervical back: Neck supple.  Lymphadenopathy:     Cervical: No cervical adenopathy.  Skin:    General: Skin is warm and dry.          Assessment & Plan:      This visit occurred during the SARS-CoV-2 public health emergency.  Safety protocols were in place, including screening questions prior to the visit, additional usage of staff PPE, and extensive cleaning of exam room  while observing appropriate contact time as indicated for disinfecting solutions.

## 2022-02-05 NOTE — Assessment & Plan Note (Signed)
Suspect asthma flare, but need to rule out infectious cause given HPI and presentation.  Chest xray pending today. Rx for prednisone 40 mg daily x 5 days sent to pharmacy. Rx for Tussionex sent to pharmacy to take HS PRN. Drowsiness precautions provided.  Consider antibiotics if warranted.

## 2022-02-06 DIAGNOSIS — R053 Chronic cough: Secondary | ICD-10-CM

## 2022-02-07 MED ORDER — AZITHROMYCIN 250 MG PO TABS
ORAL_TABLET | ORAL | 0 refills | Status: DC
Start: 2022-02-07 — End: 2022-03-23

## 2022-02-21 ENCOUNTER — Other Ambulatory Visit: Payer: Self-pay | Admitting: Primary Care

## 2022-02-21 ENCOUNTER — Other Ambulatory Visit: Payer: Self-pay | Admitting: Family Medicine

## 2022-02-21 DIAGNOSIS — R519 Headache, unspecified: Secondary | ICD-10-CM

## 2022-02-21 NOTE — Telephone Encounter (Signed)
I usually let the PCPs start managing NSAIDs.  If you would rather, I am happy to keep doing so.

## 2022-02-21 NOTE — Telephone Encounter (Signed)
Thanks for seeing her!  Joellen, will you find out from patient if she still needs the diclofenac medication for her shoulder?

## 2022-02-21 NOTE — Telephone Encounter (Signed)
Last office visit 02/05/22 for Asthma.  Last refilled 10/24/2021 for #60 with 3 refills by Dr. Lorelei Pont for Left Shoulder Pain.  No future appointments with PCP or Dr. Lorelei Pont.

## 2022-02-22 ENCOUNTER — Other Ambulatory Visit: Payer: Self-pay | Admitting: Primary Care

## 2022-02-22 DIAGNOSIS — F411 Generalized anxiety disorder: Secondary | ICD-10-CM

## 2022-02-22 MED ORDER — SERTRALINE HCL 50 MG PO TABS: 75.0000 mg | ORAL_TABLET | Freq: Every day | ORAL | 1 refills | Status: DC

## 2022-02-25 NOTE — Telephone Encounter (Signed)
Joellen, please follow up.

## 2022-02-26 NOTE — Telephone Encounter (Signed)
Call can not be completed at this time.

## 2022-02-27 NOTE — Telephone Encounter (Signed)
Called patient she does not need refill not having any issues with shoulder at this time. Will call if any changes.  ?

## 2022-03-01 DIAGNOSIS — J45909 Unspecified asthma, uncomplicated: Secondary | ICD-10-CM

## 2022-03-01 NOTE — Telephone Encounter (Signed)
Called patient she states that she has been taking her Symbicort bid and Claritin daily. She is using her albuterol about twice a day. Started back on Monday. Cough is worse at night and has some wheezing. Advised patient that you are our of the office until next week. Recommended that she be seen as virtual visit to see if any medication adjustments need to be made. She is not able to do it at this time. She would like to send message to you. If any increased or change in symptoms she will be seen at ED or urgent care.  ?

## 2022-03-05 MED ORDER — MONTELUKAST SODIUM 10 MG PO TABS
10.0000 mg | ORAL_TABLET | Freq: Every day | ORAL | 0 refills | Status: DC
Start: 2022-03-05 — End: 2022-03-23

## 2022-03-23 ENCOUNTER — Other Ambulatory Visit: Payer: Self-pay

## 2022-03-23 ENCOUNTER — Encounter: Payer: Self-pay | Admitting: Emergency Medicine

## 2022-03-23 ENCOUNTER — Ambulatory Visit
Admission: EM | Admit: 2022-03-23 | Discharge: 2022-03-23 | Disposition: A | Discharge status: Home / Self Care | Payer: 59 | Attending: Physician Assistant | Admitting: Physician Assistant

## 2022-03-23 DIAGNOSIS — R52 Pain, unspecified: Secondary | ICD-10-CM | POA: Insufficient documentation

## 2022-03-23 DIAGNOSIS — R509 Fever, unspecified: Secondary | ICD-10-CM | POA: Insufficient documentation

## 2022-03-23 LAB — POCT RAPID STREP A (OFFICE): Rapid Strep A Screen: NEGATIVE

## 2022-03-23 LAB — POCT INFLUENZA A/B
Influenza A, POC: NEGATIVE
Influenza B, POC: NEGATIVE

## 2022-03-23 NOTE — ED Provider Notes (Signed)
?[facility] ? ? ? ?CSN: [id number] ?Arrival date & time: 03/23/22  1336 ? ? ?  ? ?History   ?Chief Complaint ?Chief Complaint  ?Patient presents with  ? Fever  ? ? ?HPI ?Kathleen Ballard is a 61 y.o. female.  ? ?Patient presents today with a 24-hour history of fever and body aches.  She reports associated extreme fatigue.  She has had some mild sore throat, congestion and cough but is unsure if this is related to recent illness or history of allergies/asthma.  She has been taking ibuprofen 800 mg with minimal improvement of symptoms.  Denies any known sick contacts.  She has had COVID several years ago.  Had COVID-19 as well as one booster.  She is also up-to-date on her influenza vaccine.  She is anxious to figure out what is causing her symptoms as she lives with her 86 year old mother does not want to bring anything to her.  Reports she was treated for pneumonia with azithromycin approximately 1 month ago but denies additional antibiotic use since that time.  She denies any abdominal pain, nausea, vomiting, diarrhea.  Denies any urinary symptoms including frequency, urgency, hematuria, dysuria. ? ? ?Past Medical History:  ?Diagnosis Date  ? Allergic rhinitis 08/03/2016  ? Allergy   ? Seasonal  ? Asthma with exacerbation 08/02/2016  ? Blood transfusion without reported diagnosis   ? Cancer [facility])   ? Stage III breast cancer - in remission  ? Depression   ? H/O bilateral mastectomy   ? PUD (peptic ulcer disease)   ? Suspected COVID-19 virus infection 01/27/2020  ? Thyroid disease   ? Graves' Disease  ? ? ?Patient Active Problem List  ? Diagnosis Date Noted  ? Persistent cough for 3 weeks or longer 02/05/2022  ? Fatigue 10/30/2021  ? Gastroesophageal reflux disease 07/06/2020  ? Lower abdominal pain 07/06/2020  ? Pain of left upper extremity 03/01/2020  ? Persistent asthma without complication 09/47/0962  ? Frequent headaches 09/30/2019  ? Migraines 09/30/2019  ? GAD (generalized anxiety disorder) 07/13/2019   ? Allergic rhinitis 08/03/2016  ? Peripheral edema 08/03/2016  ? Overlapping malignant neoplasm of female breast ([place]) 10/23/2015  ? Encounter for annual general medical examination with abnormal findings in adult 11/03/2014  ? Chronic shoulder pain 08/10/2014  ? ? ?Past Surgical History:  ?Procedure Laterality Date  ? ABDOMINAL HYSTERECTOMY    ? Partial - both ovaries remain.  ? BREAST BIOPSY    ? CHOLECYSTECTOMY    ? ESOPHAGEAL DILATION    ? ? ?OB History   ?No obstetric history on file. ?  ? ? ? ?Home Medications   ? ?Prior to Admission medications   ?Medication Sig Start Date End Date Taking? Authorizing Provider  ?albuterol (VENTOLIN HFA) 108 (90 Base) MCG/ACT inhaler INHALE 2 PUFFS EVERY 6 (SIX) HOURS AS NEEDED FOR WHEEZING OR SHORTNESS OF BREATH 01/06/22  Yes Pleas [name], NP  ?chlorpheniramine-HYDROcodone (TUSSIONEX PENNKINETIC ER) 10-8 MG/5ML Take 5 mLs by mouth every 12 (twelve) hours as needed for cough. 02/05/22  Yes Pleas [name], NP  ?diclofenac (VOLTAREN) 75 MG EC tablet Take 1 tablet (75 mg total) by mouth 2 (two) times daily. 10/24/21  Yes Copland, Frederico Hamman, MD  ?omeprazole (PRILOSEC) 20 MG capsule TAKE 1 CAPSULE BY MOUTH ONCE OR TWICE DAILY FOR HEARTBURN. 09/16/21  Yes Pleas [name], NP  ?propranolol ER (INDERAL LA) 120 MG 24 hr capsule TAKE 1 CAPSULE (120 MG TOTAL) BY MOUTH DAILY. FOR HEADACHE PREVENTION 02/21/22  Yes Pleas [name], NP  ?sertraline (ZOLOFT) 50 MG tablet Take 1.5 tablets (75 mg total) by mouth daily. For anxiety. 02/22/22  Yes Pleas [name], NP  ?SUMAtriptan (IMITREX) 25 MG tablet 1- 2 TABS AT MIGRAINE ONSET. REPEAT WITH 1-2 tablets in 2HRS IF HEADACHE PERSISTS OR RECURS NO MORE >4/24HRS 05/17/21  Yes Pleas [name], NP  ?SYMBICORT 80-4.5 MCG/ACT inhaler TAKE 2 PUFFS BY MOUTH TWICE A DAY 06/01/21  Yes Pleas [name], NP  ?traMADol (ULTRAM) 50 MG tablet Take 1 tablet (50 mg total) by mouth every 8 (eight) hours as needed for moderate pain. 10/24/21  Yes  Copland, Frederico Hamman, MD  ? ? ?Family History ?Family History  ?Problem Relation Age of Onset  ? Heart disease Mother 15  ?     CAD/PTCA  ? Heart disease Father   ?     atrrial fibrillation  ? Heart disease Maternal Grandmother   ? Cancer Maternal Grandmother   ?     Breast cancer  ? COPD Maternal Grandfather   ? ? ?Social History ?Social History  ? ?Tobacco Use  ? Smoking status: Never  ? Smokeless tobacco: Never  ?Substance Use Topics  ? Alcohol use: No  ?  Alcohol/week: 0.0 standard drinks  ? Drug use: No  ? ? ? ?Allergies   ?Erythromycin and Penicillins ? ? ?Review of Systems ?Review of Systems  ?Constitutional:  Positive for activity change, fatigue and fever. Negative for appetite change.  ?HENT:  Positive for congestion and sore throat. Negative for sinus pressure and sneezing.   ?Respiratory:  Positive for cough. Negative for shortness of breath.   ?Cardiovascular:  Negative for chest pain.  ?Gastrointestinal:  Negative for abdominal pain, diarrhea, nausea and vomiting.  ?Genitourinary:  Negative for dysuria, frequency and urgency.  ?Musculoskeletal:  Negative for arthralgias and myalgias.  ?Neurological:  Negative for dizziness, light-headedness and headaches.  ? ? ?Physical Exam ?Triage Vital Signs ?ED Triage Vitals  ?Enc Vitals Group  ?   BP 03/23/22 1350 122/80  ?   Pulse Rate 03/23/22 1350 70  ?   Resp 03/23/22 1350 18  ?   Temp 03/23/22 1350 98.3 ?F (36.8 ?C)  ?   Temp Source 03/23/22 1350 Oral  ?   SpO2 03/23/22 1350 95 %  ?   Weight 03/23/22 1352 243 lb 13.3 oz (110.6 kg)  ?   Height 03/23/22 1352 5\' 9"  (1.753 m)  ?   Head Circumference --   ?   Peak Flow --   ?   Pain Score 03/23/22 1352 5  ?   Pain Loc --   ?   Pain Edu? --   ?   Excl. in [place]? --   ? ?No data found. ? ?Updated Vital Signs ?BP 122/80 (BP Location: Right Arm)   Pulse 70   Temp 98.3 ?F (36.8 ?C) (Oral)   Resp 18   Ht 5\' 9"  (1.753 m)   Wt 243 lb 13.3 oz (110.6 kg)   SpO2 95%   BMI 36.01 kg/m?  ? ?Visual Acuity ?Right Eye Distance:    ?Left Eye Distance:   ?Bilateral Distance:   ? ?Right Eye Near:   ?Left Eye Near:    ?Bilateral Near:    ? ?Physical Exam ?Vitals reviewed.  ?Constitutional:   ?   General: She is awake. She is not in acute distress. ?   Appearance: Normal appearance. She is well-developed. She is not ill-appearing.  ?   Comments:  Very pleasant female appears stated age in no acute distress sitting comfortably in exam room  ?HENT:  ?   Head: Normocephalic and atraumatic.  ?   Right Ear: Tympanic membrane, ear canal and external ear normal. Tympanic membrane is not erythematous or bulging.  ?   Left Ear: Tympanic membrane, ear canal and external ear normal. Tympanic membrane is not erythematous or bulging.  ?   Nose:  ?   Right Sinus: No maxillary sinus tenderness or frontal sinus tenderness.  ?   Left Sinus: No maxillary sinus tenderness or frontal sinus tenderness.  ?   Mouth/Throat:  ?   Pharynx: Uvula midline. Posterior oropharyngeal erythema present. No oropharyngeal exudate.  ?Cardiovascular:  ?   Rate and Rhythm: Normal rate and regular rhythm.  ?   Heart sounds: Normal heart sounds, S1 normal and S2 normal. No murmur heard. ?Pulmonary:  ?   Effort: Pulmonary effort is normal.  ?   Breath sounds: Normal breath sounds. No wheezing, rhonchi or rales.  ?   Comments: Clear to auscultation bilaterally ?Lymphadenopathy:  ?   Head:  ?   Right side of head: No submental, submandibular or tonsillar adenopathy.  ?   Left side of head: No submental, submandibular or tonsillar adenopathy.  ?   Cervical: No cervical adenopathy.  ?Psychiatric:     ?   Behavior: Behavior is cooperative.  ? ? ? ?UC Treatments / Results  ?Labs ?(all labs ordered are listed, but only abnormal results are displayed) ?Labs Reviewed  ?CULTURE, GROUP A STREP [facility])  ?POCT INFLUENZA A/B  ?POCT RAPID STREP A (OFFICE)  ? ? ?EKG ? ? ?Radiology ?No results found. ? ?Procedures ?Procedures (including critical care time) ? ?Medications Ordered in UC ?Medications - No  data to display ? ?Initial Impression / Assessment and Plan / UC Course  ?I have reviewed the triage vital signs and the nursing notes. ? ?Pertinent labs & imaging results that were available during my care of the pat

## 2022-03-23 NOTE — Discharge Instructions (Signed)
Your flu and strep test were negative.  We did not send off a COVID as you have had negative COVID test at home.  Do recommend if you develop any additional symptoms or have recurrent fever you take another at-home COVID test.  Try to avoid being around your mother and make sure to wear a mask and wash your hands as much as possible.  I have given you a work excuse note.  If you develop any additional symptoms or if anything worsens please return immediately for reevaluation.  If symptoms are improving by next week return here or see your PCP. ?

## 2022-03-23 NOTE — ED Triage Notes (Signed)
Patient c/o extreme fatigue, body aches, low grade fever that started last night.  Patient has taken Ibuprofen 800mg . ?

## 2022-03-25 LAB — CULTURE, GROUP A STREP (THRC)

## 2022-04-05 IMAGING — DX DG SHOULDER 2+V*L*
3 series · 3 of 3 positions shown · non-contrast
Comparison: None.

CLINICAL DATA: Pain

EXAM:
LEFT SHOULDER - 3 VIEW

[shoulder grashey]
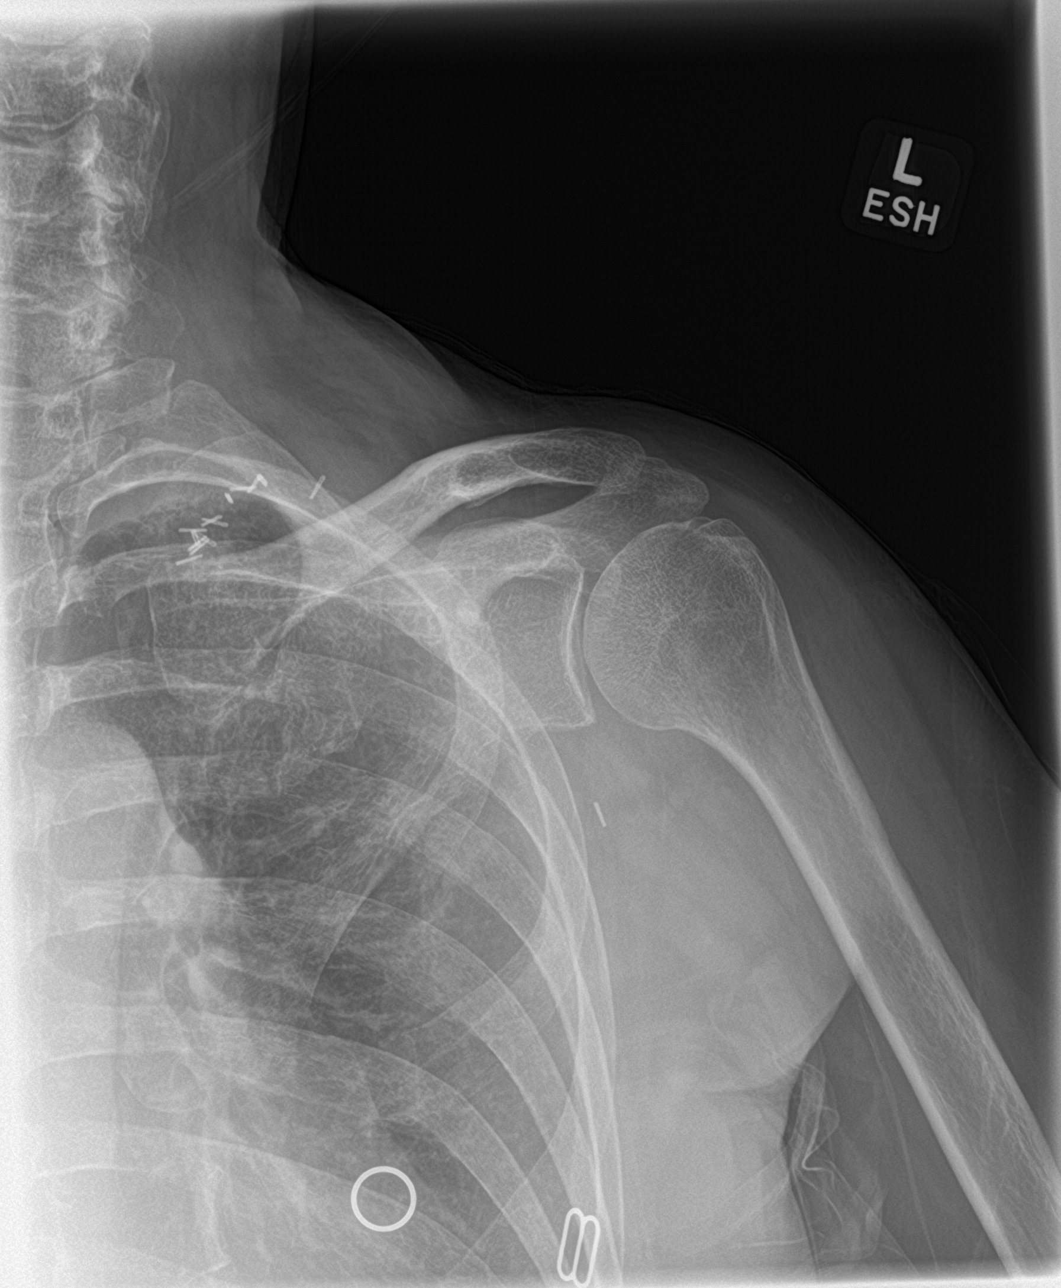

[shoulder y view]
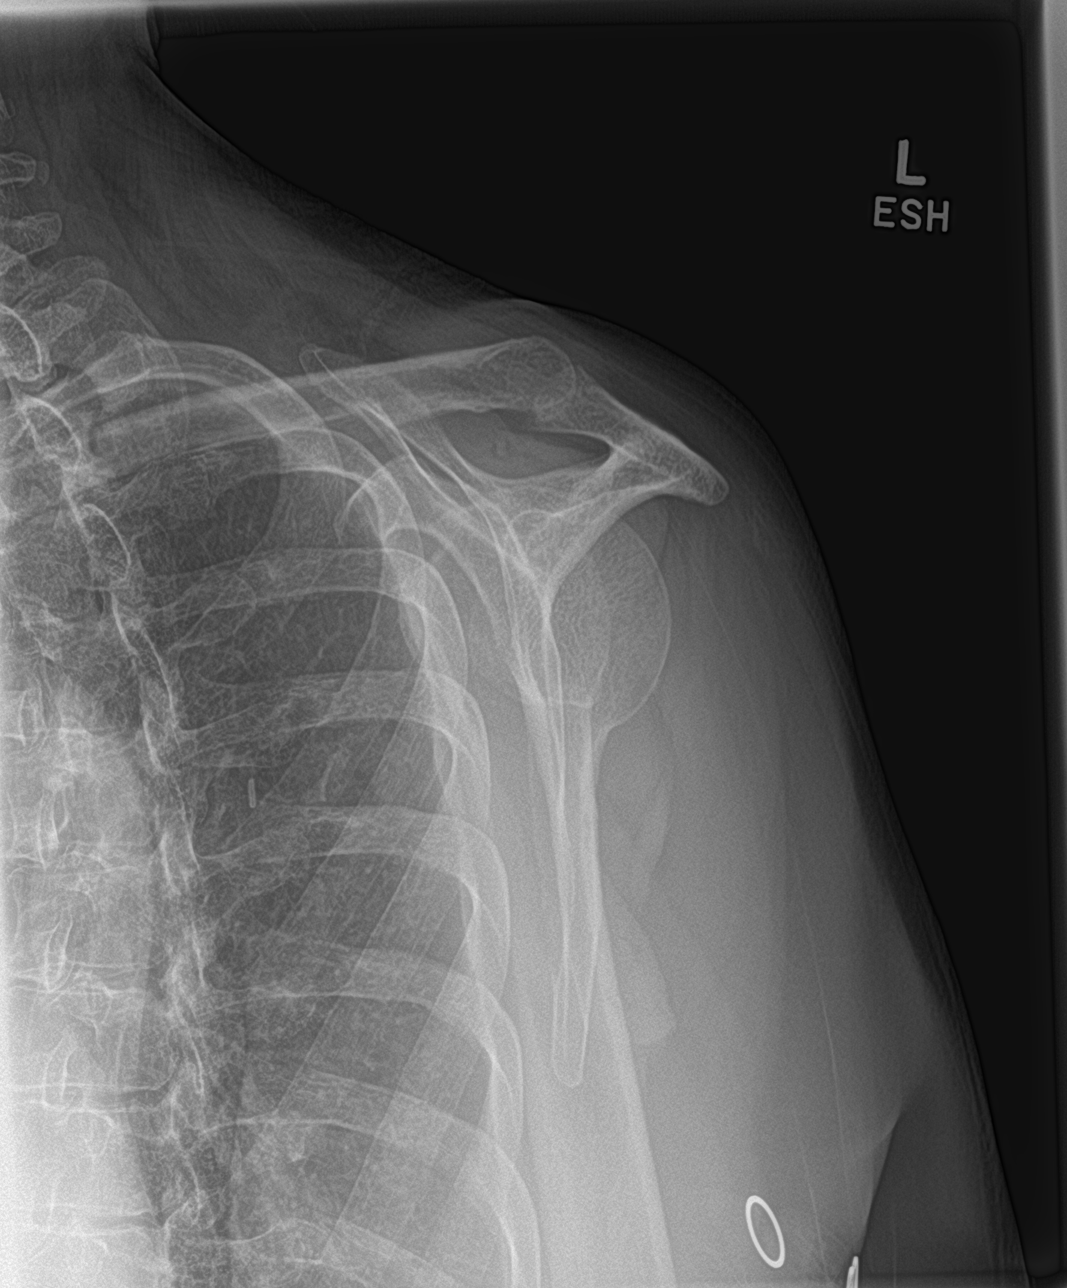

[shoulder axillary]
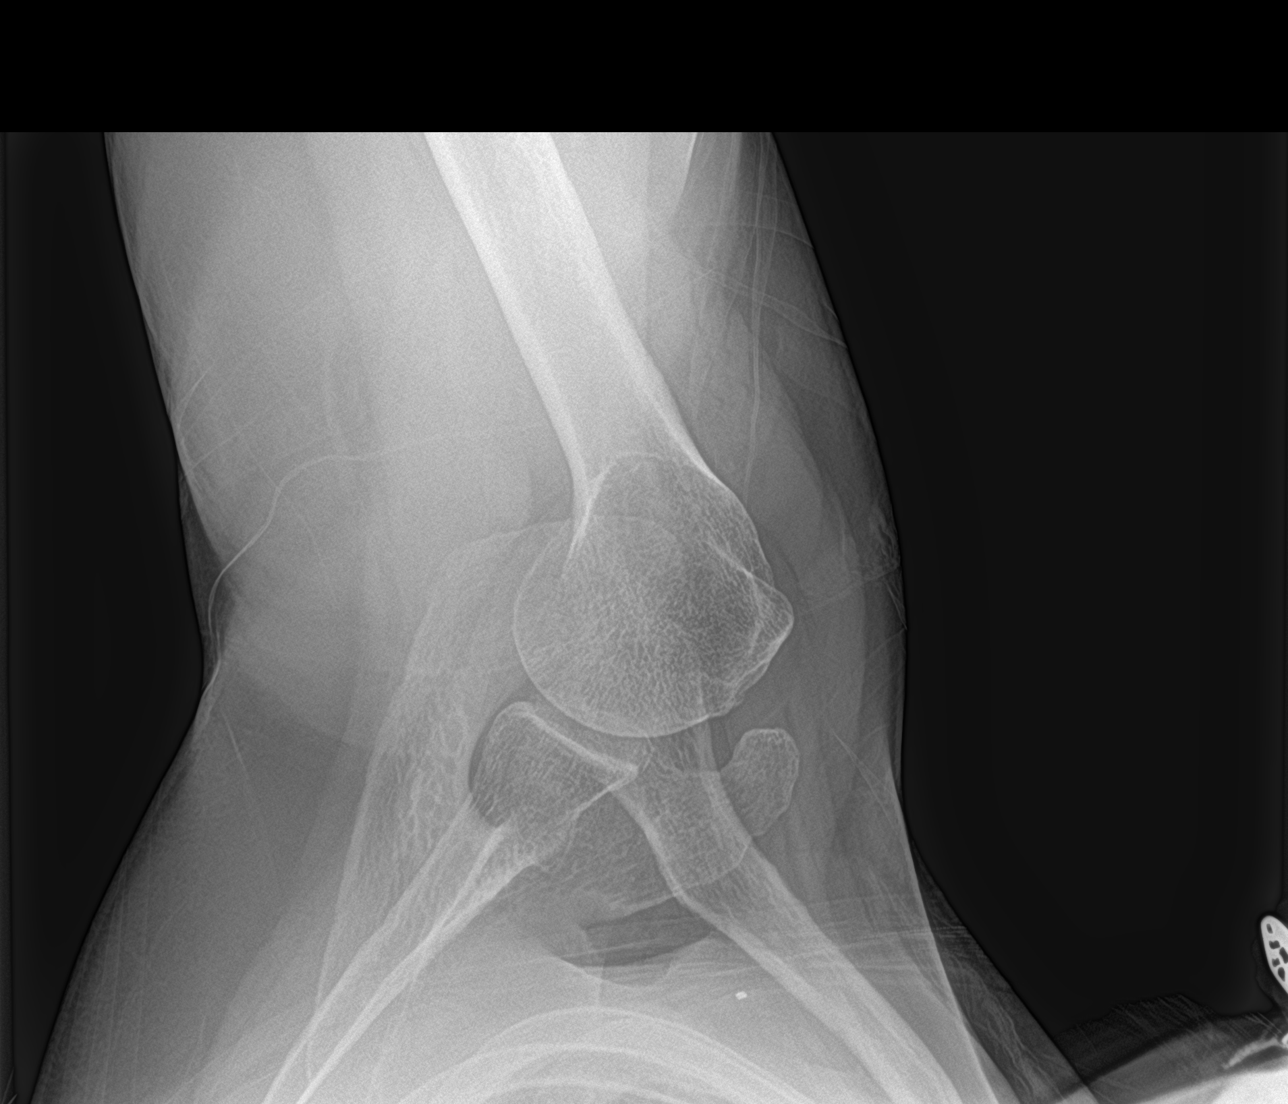

[3 of 3 positions shown; findings below may reference images not displayed]

FINDINGS: There is no evidence of fracture or dislocation. There is no
evidence of arthropathy or other focal bone abnormality. Surgical
clips noted in the axilla and supraclavicular region. Soft tissues
otherwise unremarkable.
IMPRESSION: No acute osseous abnormality or significant degenerative changes.

## 2022-07-25 DIAGNOSIS — Z8669 Personal history of other diseases of the nervous system and sense organs: Secondary | ICD-10-CM | POA: Diagnosis not present

## 2022-07-25 DIAGNOSIS — M25512 Pain in left shoulder: Secondary | ICD-10-CM | POA: Diagnosis not present

## 2022-07-25 DIAGNOSIS — E559 Vitamin D deficiency, unspecified: Secondary | ICD-10-CM | POA: Diagnosis not present

## 2022-07-25 DIAGNOSIS — G8929 Other chronic pain: Secondary | ICD-10-CM | POA: Diagnosis not present

## 2022-07-25 DIAGNOSIS — J4530 Mild persistent asthma, uncomplicated: Secondary | ICD-10-CM | POA: Diagnosis not present

## 2022-07-25 DIAGNOSIS — R69 Illness, unspecified: Secondary | ICD-10-CM | POA: Diagnosis not present

## 2022-07-25 DIAGNOSIS — K219 Gastro-esophageal reflux disease without esophagitis: Secondary | ICD-10-CM | POA: Diagnosis not present

## 2022-07-25 DIAGNOSIS — E7800 Pure hypercholesterolemia, unspecified: Secondary | ICD-10-CM | POA: Diagnosis not present

## 2022-07-25 DIAGNOSIS — R7989 Other specified abnormal findings of blood chemistry: Secondary | ICD-10-CM | POA: Diagnosis not present

## 2022-08-11 DIAGNOSIS — Z6834 Body mass index (BMI) 34.0-34.9, adult: Secondary | ICD-10-CM | POA: Diagnosis not present

## 2022-08-11 DIAGNOSIS — R030 Elevated blood-pressure reading, without diagnosis of hypertension: Secondary | ICD-10-CM | POA: Diagnosis not present

## 2022-08-11 DIAGNOSIS — J4521 Mild intermittent asthma with (acute) exacerbation: Secondary | ICD-10-CM | POA: Diagnosis not present

## 2022-08-26 ENCOUNTER — Other Ambulatory Visit: Payer: Self-pay | Admitting: Primary Care

## 2022-08-26 DIAGNOSIS — F411 Generalized anxiety disorder: Secondary | ICD-10-CM

## 2022-08-26 NOTE — Telephone Encounter (Signed)
Patient is due for CPE/follow up in early November 2023. Please schedule.

## 2022-08-27 NOTE — Telephone Encounter (Signed)
Called patient, no vm left as patient phone disconnected.

## 2022-10-06 DIAGNOSIS — R051 Acute cough: Secondary | ICD-10-CM | POA: Diagnosis not present

## 2022-10-06 DIAGNOSIS — R0981 Nasal congestion: Secondary | ICD-10-CM | POA: Diagnosis not present

## 2022-10-06 DIAGNOSIS — Z23 Encounter for immunization: Secondary | ICD-10-CM | POA: Diagnosis not present

## 2022-10-06 DIAGNOSIS — J4541 Moderate persistent asthma with (acute) exacerbation: Secondary | ICD-10-CM | POA: Diagnosis not present

## 2022-10-06 DIAGNOSIS — Z6834 Body mass index (BMI) 34.0-34.9, adult: Secondary | ICD-10-CM | POA: Diagnosis not present

## 2022-10-06 DIAGNOSIS — Z03818 Encounter for observation for suspected exposure to other biological agents ruled out: Secondary | ICD-10-CM | POA: Diagnosis not present

## 2022-10-15 ENCOUNTER — Inpatient Hospital Stay: Payer: Self-pay | Attending: Adult Health | Admitting: Adult Health

## 2022-11-12 DIAGNOSIS — K219 Gastro-esophageal reflux disease without esophagitis: Secondary | ICD-10-CM | POA: Diagnosis not present

## 2022-11-12 DIAGNOSIS — J309 Allergic rhinitis, unspecified: Secondary | ICD-10-CM | POA: Diagnosis not present

## 2022-11-12 DIAGNOSIS — Z853 Personal history of malignant neoplasm of breast: Secondary | ICD-10-CM | POA: Diagnosis not present

## 2022-11-12 DIAGNOSIS — R051 Acute cough: Secondary | ICD-10-CM | POA: Diagnosis not present

## 2022-11-12 DIAGNOSIS — J4530 Mild persistent asthma, uncomplicated: Secondary | ICD-10-CM | POA: Diagnosis not present
